# Patient Record
Sex: Male | Born: 1970 | Race: White | Hispanic: No | Marital: Single | Smoking: Former smoker
Health system: Southern US, Community
[De-identification: ages and names within clinical notes are randomized; demographics above are authoritative.]

## PROBLEM LIST (undated history)

## (undated) DIAGNOSIS — E785 Hyperlipidemia, unspecified: Secondary | ICD-10-CM

## (undated) DIAGNOSIS — K219 Gastro-esophageal reflux disease without esophagitis: Secondary | ICD-10-CM

## (undated) DIAGNOSIS — I10 Essential (primary) hypertension: Secondary | ICD-10-CM

## (undated) HISTORY — PX: REPAIR / SUTURE TESTICULAR INJURY: SUR1151

## (undated) HISTORY — DX: Gastro-esophageal reflux disease without esophagitis: K21.9

## (undated) HISTORY — PX: ORCHIOPEXY: SHX479

## (undated) HISTORY — DX: Hyperlipidemia, unspecified: E78.5

---

## 1998-08-24 ENCOUNTER — Ambulatory Visit (HOSPITAL_COMMUNITY): Admission: RE | Admit: 1998-08-24 | Discharge: 1998-08-24 | Payer: Self-pay | Admitting: Internal Medicine

## 1998-08-24 ENCOUNTER — Encounter: Payer: Self-pay | Admitting: Internal Medicine

## 2006-12-26 ENCOUNTER — Emergency Department (HOSPITAL_COMMUNITY): Admission: EM | Admit: 2006-12-26 | Discharge: 2006-12-26 | Payer: Self-pay | Admitting: Emergency Medicine

## 2008-01-14 ENCOUNTER — Emergency Department (HOSPITAL_COMMUNITY): Admission: EM | Admit: 2008-01-14 | Discharge: 2008-01-14 | Payer: Self-pay | Admitting: Emergency Medicine

## 2011-08-13 ENCOUNTER — Ambulatory Visit: Payer: BC Managed Care – PPO | Admitting: Family Medicine

## 2011-08-13 VITALS — BP 138/98 | HR 86 | Temp 98.5°F | Resp 16 | Ht 71.0 in | Wt 188.6 lb

## 2011-08-13 DIAGNOSIS — J069 Acute upper respiratory infection, unspecified: Secondary | ICD-10-CM

## 2011-08-13 DIAGNOSIS — J329 Chronic sinusitis, unspecified: Secondary | ICD-10-CM

## 2011-08-13 MED ORDER — AMOXICILLIN 875 MG PO TABS: 875.0000 mg | ORAL_TABLET | Freq: Two times a day (BID) | ORAL | Status: AC

## 2011-08-13 MED ORDER — FLUTICASONE PROPIONATE 50 MCG/ACT NA SUSP: 2.0000 | Freq: Every day | NASAL | Status: DC

## 2011-08-13 NOTE — Progress Notes (Signed)
Subjective: 41 year old male who's been having problems for the past several days with upper sparked right congestion. He was feverish yesterday morning. He is tender in his sinuses. He has been blowing purulent mucus out of his nose. He does have some cough. He has been working despite not feeling well.  Objective: Alert oriented no acute distress but doesn't look like he feels great. His TMs are normal. Nose congested. Tender over his maxillary sinuses. Throat was clear. Neck supple without nodes or thyromegaly. Chest clear to auscultation. Heart regular without murmurs. No CVA tenderness.  Assessment: Sinusitis and URI  Plan:   Amoxicillin 875 twice a day Flonase 2 sprays twice a day for 3 days, then drop back to 2 sprays daily.  Number labs will be done today. He can return if worse.

## 2011-08-13 NOTE — Patient Instructions (Addendum)

## 2012-01-24 ENCOUNTER — Ambulatory Visit (INDEPENDENT_AMBULATORY_CARE_PROVIDER_SITE_OTHER): Payer: BC Managed Care – PPO | Admitting: Emergency Medicine

## 2012-01-24 VITALS — BP 114/84 | HR 74 | Temp 97.9°F | Resp 16 | Ht 70.0 in | Wt 186.2 lb

## 2012-01-24 DIAGNOSIS — J029 Acute pharyngitis, unspecified: Secondary | ICD-10-CM

## 2012-01-24 MED ORDER — AZITHROMYCIN 250 MG PO TABS: ORAL_TABLET | ORAL | Status: DC

## 2012-01-24 NOTE — Patient Instructions (Addendum)

## 2012-01-24 NOTE — Progress Notes (Signed)
[facility] [address] [place] [address] [phone]  Date:  01/24/2012   Name:  Eddie Johnson   DOB:  [birth year]   MRN:  [id number]  PCP:  No primary provider on file.    Chief Complaint: Sore Throat and Otalgia   History of Present Illness:  Eddie Johnson is a 41 y.o. very pleasant male patient who presents with the following:  Sore throat past few days.  Has pain in left ear and left neck. Last night awakened at 0200 with pain and unable to sleep for a short terms.  No cough or coryza, fever or chills. No wheezing or shortness of breath.  No nausea or vomiting or stool change.  There is no problem list on file for this patient.   History reviewed. No pertinent past medical history.  History reviewed. No pertinent past surgical history.  History  Substance Use Topics  . Smoking status: Current Every Day Smoker -- 0.8 packs/day    Types: Cigarettes  . Smokeless tobacco: Not on file  . Alcohol Use: Not on file    Family History  Problem Relation Age of Onset  . Cancer Mother     No Known Allergies  Medication list has been reviewed and updated.  Current Outpatient Prescriptions on File Prior to Visit  Medication Sig Dispense Refill  . omeprazole (PRILOSEC) 10 MG capsule Take 10 mg by mouth daily.      . fluticasone (FLONASE) 50 MCG/ACT nasal spray Place 2 sprays into the nose daily.  16 g  6    Review of Systems:  As per HPI, otherwise negative.    Physical Examination: Filed Vitals:   01/24/12 1431  BP: 114/84  Pulse: 74  Temp: 97.9 F (36.6 C)  Resp: 16   Filed Vitals:   01/24/12 1431  Height: 5\' 10"  (1.778 m)  Weight: 186 lb 3.2 oz (84.46 kg)   Body mass index is 26.72 kg/(m^2). Ideal Body Weight: Weight in (lb) to have BMI = 25: 173.9   GEN: WDWN, NAD, Non-toxic, A & O x 3.   No rash or sepsis HEENT: Atraumatic, Normocephalic. Neck supple. No masses, left anterior cervical LAD.  Oropharynx red and  swollen left tonsil no exudate Ears and Nose: No external deformity.  TM negative CV: RRR, No M/G/R. No JVD. No thrill. No extra heart sounds. PULM: CTA B, no wheezes, crackles, rhonchi. No retractions. No resp. distress. No accessory muscle use. ABD: S, NT, ND, +BS. No rebound. No HSM. EXTR: No c/c/e NEURO Normal gait.  PSYCH: Normally interactive. Conversant. Not depressed or anxious appearing.  Calm demeanor.    Assessment and Plan: Pharyngitis Smoker zpak Stop smoking counseling Follow up as needed  Carmelina Dane, MD

## 2012-05-02 ENCOUNTER — Ambulatory Visit (INDEPENDENT_AMBULATORY_CARE_PROVIDER_SITE_OTHER): Payer: BC Managed Care – PPO | Admitting: Family Medicine

## 2012-05-02 VITALS — BP 138/86 | HR 88 | Temp 97.7°F | Resp 18 | Ht 71.5 in | Wt 191.0 lb

## 2012-05-02 DIAGNOSIS — J019 Acute sinusitis, unspecified: Secondary | ICD-10-CM

## 2012-05-02 MED ORDER — LEVOFLOXACIN 500 MG PO TABS: 500.0000 mg | ORAL_TABLET | Freq: Every day | ORAL | Status: DC

## 2012-05-02 MED ORDER — CETIRIZINE-PSEUDOEPHEDRINE ER 5-120 MG PO TB12: 1.0000 | ORAL_TABLET | Freq: Two times a day (BID) | ORAL | Status: DC

## 2012-05-02 NOTE — Progress Notes (Signed)
Patient ID: Eddie Johnson MRN: [id number], DOB: [birth year], 42 y.o. Date of Encounter: 05/02/2012, 11:52 AM  Primary Physician: No primary provider on file.  Chief Complaint:  Chief Complaint  Patient presents with  . Cough  . Dizziness  . Sore Throat    HPI: 42 y.o. year old male presents with 2 day history of nasal congestion, post nasal drip, sore throat, sinus pressure, and cough. Afebrile. No chills. Nasal congestion thick and green/yellow. Sinus pressure is the worst symptom. Cough is productive secondary to post nasal drip and not associated with time of day. Ears feel full, leading to sensation of muffled hearing. Has tried OTC cold preps without success. No GI complaints.   No recent antibiotics, recent travels, or sick contacts   No leg trauma, sedentary periods, h/o cancer, or tobacco use.  History reviewed. No pertinent past medical history.   Home Meds: Prior to Admission medications   Medication Sig Start Date End Date Taking? Authorizing Provider  omeprazole (PRILOSEC) 10 MG capsule Take 10 mg by mouth daily.   Yes Historical Provider, MD  vitamin C (ASCORBIC ACID) 500 MG tablet Take 500 mg by mouth daily.   Yes Historical Provider, MD  fluticasone (FLONASE) 50 MCG/ACT nasal spray Place 2 sprays into the nose daily. 08/13/11 08/12/12  Peyton Najjar, MD    Allergies: No Known Allergies  History   Social History  . Marital Status: Single    Spouse Name: N/A    Number of Children: N/A  . Years of Education: N/A   Occupational History  . Not on file.   Social History Main Topics  . Smoking status: Current Every Day Smoker -- 0.80 packs/day    Types: Cigarettes  . Smokeless tobacco: Not on file  . Alcohol Use: No  . Drug Use: No  . Sexually Active: Not on file   Other Topics Concern  . Not on file   Social History Narrative  . No narrative on file     Review of Systems: Constitutional: negative for chills, fever, night sweats or weight  changes Cardiovascular: negative for chest pain or palpitations Respiratory: negative for hemoptysis, wheezing, or shortness of breath Abdominal: negative for abdominal pain, nausea, vomiting or diarrhea Dermatological: negative for rash Neurologic: negative for headache   Physical Exam: Blood pressure 138/86, pulse 88, temperature 97.7 F (36.5 C), temperature source Oral, resp. rate 18, height 5' 11.5" (1.816 m), weight 191 lb (86.637 kg), SpO2 97.00%., Body mass index is 26.27 kg/(m^2). General: Well developed, well nourished, in no acute distress. Head: Normocephalic, atraumatic, eyes without discharge, sclera non-icteric, nares are congested. Bilateral auditory canals clear, TM's are without perforation, pearly grey with reflective cone of light bilaterally. Serous effusion bilaterally behind TM's. Maxillary sinus TTP. Oral cavity moist, dentition normal. Posterior pharynx with post nasal drip and mild erythema. No peritonsillar abscess or tonsillar exudate. Neck: Supple. No thyromegaly. Full ROM. No lymphadenopathy. Lungs: Clear bilaterally to auscultation without wheezes, rales, or rhonchi. Breathing is unlabored.  Heart: RRR with S1 S2. No murmurs, rubs, or gallops appreciated. Msk:  Strength and tone normal for age. Extremities: No clubbing or cyanosis. No edema. Neuro: Alert and oriented X 3. Moves all extremities spontaneously. CNII-XII grossly in tact. Psych:  Responds to questions appropriately with a normal affect.   Labs:   ASSESSMENT AND PLAN:  42 y.o. year old male with sinusitis -  -Tylenol/Motrin prn -Rest/fluids -RTC precautions -RTC 3-5 days if no improvement  Signed, Elvina Sidle,  MD 05/02/2012 11:52 AM

## 2012-05-02 NOTE — Patient Instructions (Addendum)

## 2012-09-29 ENCOUNTER — Other Ambulatory Visit: Payer: Self-pay

## 2012-09-29 DIAGNOSIS — J019 Acute sinusitis, unspecified: Secondary | ICD-10-CM

## 2012-09-29 MED ORDER — CETIRIZINE-PSEUDOEPHEDRINE ER 5-120 MG PO TB12: 1.0000 | ORAL_TABLET | Freq: Two times a day (BID) | ORAL | Status: DC

## 2012-11-12 ENCOUNTER — Telehealth: Payer: Self-pay

## 2012-11-12 DIAGNOSIS — J019 Acute sinusitis, unspecified: Secondary | ICD-10-CM

## 2012-11-12 MED ORDER — CETIRIZINE-PSEUDOEPHEDRINE ER 5-120 MG PO TB12: 1.0000 | ORAL_TABLET | Freq: Two times a day (BID) | ORAL | Status: DC

## 2012-11-12 NOTE — Telephone Encounter (Signed)
Pharmacist called and req'd RFs for zyrtec Rx. I gave her RF info on phone.

## 2013-05-04 ENCOUNTER — Other Ambulatory Visit: Payer: Self-pay | Admitting: Family Medicine

## 2013-06-10 ENCOUNTER — Other Ambulatory Visit: Payer: Self-pay | Admitting: Family Medicine

## 2013-09-01 ENCOUNTER — Ambulatory Visit (INDEPENDENT_AMBULATORY_CARE_PROVIDER_SITE_OTHER): Payer: BC Managed Care – PPO | Admitting: Emergency Medicine

## 2013-09-01 VITALS — BP 138/100 | HR 90 | Temp 98.1°F | Resp 16 | Ht 69.5 in | Wt 199.0 lb

## 2013-09-01 DIAGNOSIS — I10 Essential (primary) hypertension: Secondary | ICD-10-CM

## 2013-09-01 DIAGNOSIS — R040 Epistaxis: Secondary | ICD-10-CM

## 2013-09-01 LAB — POCT CBC
Granulocyte percent: 66.7 %G (ref 37–80)
HEMATOCRIT: 46 % (ref 43.5–53.7)
Hemoglobin: 15 g/dL (ref 14.1–18.1)
LYMPH, POC: 2.1 (ref 0.6–3.4)
MCH, POC: 30.2 pg (ref 27–31.2)
MCHC: 32.5 g/dL (ref 31.8–35.4)
MCV: 93 fL (ref 80–97)
MID (cbc): 0.5 (ref 0–0.9)
MPV: 7.6 fL (ref 0–99.8)
POC GRANULOCYTE: 5.1 (ref 2–6.9)
POC LYMPH %: 27.3 % (ref 10–50)
POC MID %: 6 %M (ref 0–12)
Platelet Count, POC: 264 10*3/uL (ref 142–424)
RBC: 4.95 M/uL (ref 4.69–6.13)
RDW, POC: 12.9 %
WBC: 7.7 10*3/uL (ref 4.6–10.2)

## 2013-09-01 MED ORDER — AMLODIPINE BESYLATE 5 MG PO TABS: 5.0000 mg | ORAL_TABLET | Freq: Every day | ORAL | Status: DC

## 2013-09-01 NOTE — Patient Instructions (Signed)
Hypertension  Hypertension, commonly called high blood pressure, is when the force of blood pumping through your arteries is too strong. Your arteries are the blood vessels that carry blood from your heart throughout your body. A blood pressure reading consists of a higher number over a lower number, such as 110/72. The higher number (systolic) is the pressure inside your arteries when your heart pumps. The lower number (diastolic) is the pressure inside your arteries when your heart relaxes. Ideally you want your blood pressure below 120/80.  Hypertension forces your heart to work harder to pump blood. Your arteries may become narrow or stiff. Having hypertension puts you at risk for heart disease, stroke, and other problems.   RISK FACTORS  Some risk factors for high blood pressure are controllable. Others are not.   Risk factors you cannot control include:    Race. You may be at higher risk if you are African American.   Age. Risk increases with age.   Gender. Men are at higher risk than women before age 45 years. After age 65, women are at higher risk than men.  Risk factors you can control include:   Not getting enough exercise or physical activity.   Being overweight.   Getting too much fat, sugar, calories, or salt in your diet.   Drinking too much alcohol.  SIGNS AND SYMPTOMS  Hypertension does not usually cause signs or symptoms. Extremely high blood pressure (hypertensive crisis) may cause headache, anxiety, shortness of breath, and nosebleed.  DIAGNOSIS   To check if you have hypertension, your health care provider will measure your blood pressure while you are seated, with your arm held at the level of your heart. It should be measured at least twice using the same arm. Certain conditions can cause a difference in blood pressure between your right and left arms. A blood pressure reading that is higher than normal on one occasion does not mean that you need treatment. If one blood pressure reading  is high, ask your health care provider about having it checked again.  TREATMENT   Treating high blood pressure includes making lifestyle changes and possibly taking medication. Living a healthy lifestyle can help lower high blood pressure. You may need to change some of your habits.  Lifestyle changes may include:   Following the DASH diet. This diet is high in fruits, vegetables, and whole grains. It is low in salt, red meat, and added sugars.   Getting at least 2 1/2 hours of brisk physical activity every week.   Losing weight if necessary.   Not smoking.   Limiting alcoholic beverages.   Learning ways to reduce stress.  If lifestyle changes are not enough to get your blood pressure under control, your health care provider may prescribe medicine. You may need to take more than one. Work closely with your health care provider to understand the risks and benefits.  HOME CARE INSTRUCTIONS   Have your blood pressure rechecked as directed by your health care provider.    Only take medicine as directed by your health care provider. Follow the directions carefully. Blood pressure medicines must be taken as prescribed. The medicine does not work as well when you skip doses. Skipping doses also puts you at risk for problems.    Do not smoke.    Monitor your blood pressure at home as directed by your health care provider.  SEEK MEDICAL CARE IF:    You think you are having a reaction to medicines taken.     You have recurrent headaches or feel dizzy.   You have swelling in your ankles.   You have trouble with your vision.  SEEK IMMEDIATE MEDICAL CARE IF:   You develop a severe headache or confusion.   You have unusual weakness, numbness, or feel faint.   You have severe chest or abdominal pain.   You vomit repeatedly.   You have trouble breathing.  MAKE SURE YOU:    Understand these instructions.   Will watch your condition.   Will get help right away if you are not doing well or get  worse.  Document Released: 01/28/2005 Document Revised: 02/02/2013 Document Reviewed: 11/20/2012  Exit[employer] Patient Information 2015 [employer]. This information is not intended to replace advice given to you by your health care provider. Make sure you discuss any questions you have with your health care provider.  Nosebleed  Nosebleeds can be caused by many conditions including trauma, infections, polyps, foreign bodies, dry mucous membranes or climate, medications and air conditioning. Most nosebleeds occur in the front of the nose. It is because of this location that most nosebleeds can be controlled by pinching the nostrils gently and continuously. Do this for at least 10 to 20 minutes. The reason for this long continuous pressure is that you must hold it long enough for the blood to clot. If during that 10 to 20 minute time period, pressure is released, the process may have to be started again. The nosebleed may stop by itself, quit with pressure, need concentrated heating (cautery) or stop with pressure from packing.  HOME CARE INSTRUCTIONS    If your nose was packed, try to maintain the pack inside until your caregiver removes it. If a gauze pack was used and it starts to fall out, gently replace or cut the end off. Do not cut if a balloon catheter was used to pack the nose. Otherwise, do not remove unless instructed.   Avoid blowing your nose for 12 hours after treatment. This could dislodge the pack or clot and start bleeding again.   If the bleeding starts again, sit up and bending forward, gently pinch the front half of your nose continuously for 20 minutes.   If bleeding was caused by dry mucous membranes, cover the inside of your nose every morning with a petroleum or antibiotic ointment. Use your little fingertip as an applicator. Do this as needed during dry weather. This will keep the mucous membranes moist and allow them to heal.   Maintain humidity in your home by using less air  conditioning or using a humidifier.   Do not use aspirin or medications which make bleeding more likely. Your caregiver can give you recommendations on this.   Resume normal activities as able but try to avoid straining, lifting or bending at the waist for several days.   If the nosebleeds become recurrent and the cause is unknown, your caregiver may suggest laboratory tests.  SEEK IMMEDIATE MEDICAL CARE IF:    Bleeding recurs and cannot be controlled.   There is unusual bleeding from or bruising on other parts of the body.   You have a fever.   Nosebleeds continue.   There is any worsening of the condition which originally brought you in.   You become lightheaded, feel faint, become sweaty or vomit blood.  MAKE SURE YOU:    Understand these instructions.   Will watch your condition.   Will get help right away if you are not doing well or get worse.    Document Released: 11/07/2004 Document Revised: 04/22/2011 Document Reviewed: 12/30/2008  Exit[employer] Patient Information 2015 [employer]. This information is not intended to replace advice given to you by your health care provider. Make sure you discuss any questions you have with your health care provider.

## 2013-09-01 NOTE — Progress Notes (Signed)
   Subjective:    Patient ID: Eddie Johnson, male    DOB: [birth year], 43 y.o.   MRN: [id number]  HPI  43 y.o. Male presents to clinic to discuss a nose bleed he had Monday night . States that he used saline to stop it. Reports feeling sluggish and fatigued. States that over the past week he has noticed dried blood in his right ear. Denies having any chest pain. Has family history of hypertension.  States that he hasn't noticed hugh weight change and eats some salt in his diet; also reports having stopped smoking earlier this year.  Review of Systems     Objective:   Physical Exam patient is alert and cooperative. His neck is supple. His chest is clear to auscultation and percussion. Heart regular rate without murmurs rubs or gallops. There is irritation to the inside of both nares. There is a bleeding site present on the left which is scabbed over  Results for orders placed in visit on 09/01/13  POCT CBC      Result Value Ref Range   WBC 7.7  4.6 - 10.2 K/uL   Lymph, poc 27.3 (*) 0.6 - 3.4   POC LYMPH PERCENT 2.1 (*) 10 - 50 %L   MID (cbc) 6.0 (*) 0 - 0.9   POC MID % 0.5  0 - 12 %M   POC Granulocyte 66.7 (*) 2 - 6.9   Granulocyte percent 5.1 (*) 37 - 80 %G   RBC 4.95  4.69 - 6.13 M/uL   Hemoglobin 15.0  14.1 - 18.1 g/dL   HCT, POC 56.246.0  13.043.5 - 53.7 %   MCV 93.0  80 - 97 fL   MCH, POC 30.2  27 - 31.2 pg   MCHC 32.5  31.8 - 35.4 g/dL   RDW, POC 86.512.9     Platelet Count, POC 264  142 - 424 K/uL   MPV 7.6  0 - 99.8 fL        Assessment & Plan:  Patient started on amlodipine 5 mg for blood pressure control. He will apply Vaseline to the inside of the nose 3 times a day. He'll continue to use saline spray

## 2013-09-02 LAB — BASIC METABOLIC PANEL WITH GFR
BUN: 10 mg/dL (ref 6–23)
CALCIUM: 9.3 mg/dL (ref 8.4–10.5)
CO2: 26 mEq/L (ref 19–32)
Chloride: 104 mEq/L (ref 96–112)
Creat: 0.69 mg/dL (ref 0.50–1.35)
GFR, Est African American: >89 mL/min
GFR, Est Non African American: >89 mL/min
GLUCOSE: 87 mg/dL (ref 70–99)
Potassium: 4.2 mEq/L (ref 3.5–5.3)
Sodium: 138 mEq/L (ref 135–145)

## 2013-09-02 LAB — TSH: TSH: 1.122 u[IU]/mL (ref 0.350–4.500)

## 2013-12-20 ENCOUNTER — Ambulatory Visit (INDEPENDENT_AMBULATORY_CARE_PROVIDER_SITE_OTHER): Payer: BC Managed Care – PPO | Admitting: Emergency Medicine

## 2013-12-20 VITALS — BP 140/96 | HR 94 | Temp 98.4°F | Resp 18 | Ht 70.75 in | Wt 200.0 lb

## 2013-12-20 DIAGNOSIS — J0100 Acute maxillary sinusitis, unspecified: Secondary | ICD-10-CM

## 2013-12-20 DIAGNOSIS — J209 Acute bronchitis, unspecified: Secondary | ICD-10-CM

## 2013-12-20 MED ORDER — PROMETHAZINE-CODEINE 6.25-10 MG/5ML PO SYRP: 5.0000 mL | ORAL_SOLUTION | Freq: Four times a day (QID) | ORAL | Status: DC | PRN

## 2013-12-20 MED ORDER — AMOXICILLIN-POT CLAVULANATE 875-125 MG PO TABS: 1.0000 | ORAL_TABLET | Freq: Two times a day (BID) | ORAL | Status: DC

## 2013-12-20 MED ORDER — PSEUDOEPHEDRINE-GUAIFENESIN ER 60-600 MG PO TB12: 1.0000 | ORAL_TABLET | Freq: Two times a day (BID) | ORAL | Status: DC

## 2013-12-20 NOTE — Patient Instructions (Signed)

## 2013-12-20 NOTE — Progress Notes (Signed)
[facility] [address], [place] [id number] [phone]  Date:  12/20/2013   Name:  Eddie Johnson   DOB:  November 30, 1970   MRN:  [id number]  PCP:  No PCP Per Patient    Chief Complaint: Cough; Nasal Congestion; and Fever   History of Present Illness:  Eddie Johnson is a 43 y.o. very pleasant male patient who presents with the following:  Ill since Friday.  Has sinus pressure and nasal congestion and purulent nasal discharge.  No sore throat Has a cough productive of purulent sputum.  No wheezing or shortness of breath. No fever now but was febrile on Friday  or chills.  No nausea or vomiting No stool change. No rash No improvement with over the counter medications or other home remedies.  Denies other complaint or health concern today.  Patient Active Problem List   Diagnosis Date Noted  . Unspecified essential hypertension 09/01/2013    History reviewed. No pertinent past medical history.  History reviewed. No pertinent past surgical history.  History  Substance Use Topics  . Smoking status: Current Every Day Smoker -- 0.80 packs/day    Types: Cigarettes    Start date: 03/21/2012  . Smokeless tobacco: Not on file  . Alcohol Use: No    Family History  Problem Relation Age of Onset  . Cancer Mother     No Known Allergies  Medication list has been reviewed and updated.  Current Outpatient Prescriptions on File Prior to Visit  Medication Sig Dispense Refill  . amLODipine (NORVASC) 5 MG tablet Take 1 tablet (5 mg total) by mouth daily. 30 tablet 5  . cetirizine-pseudoephedrine (ALL DAY ALLERGY-D) 5-120 MG per tablet Take 1 tablet by mouth 2 (two) times daily. PATIENT NEEDS OFFICE VISIT FOR ADDITIONAL REFILLS 60 tablet 0  . omeprazole (PRILOSEC) 10 MG capsule Take 10 mg by mouth daily.    . vitamin C (ASCORBIC ACID) 500 MG tablet Take 500 mg by mouth daily.    . fluticasone (FLONASE) 50 MCG/ACT nasal spray Place 2 sprays into the  nose daily. 16 g 6   No current facility-administered medications on file prior to visit.    Review of Systems:  As per HPI, otherwise negative.    Physical Examination: Filed Vitals:   12/20/13 1219  BP: 140/96  Pulse: 94  Temp: 98.4 F (36.9 C)  Resp: 18   Filed Vitals:   12/20/13 1219  Height: 5' 10.75" (1.797 m)  Weight: 200 lb (90.719 kg)   Body mass index is 28.09 kg/(m^2). Ideal Body Weight: Weight in (lb) to have BMI = 25: 177.6  GEN: WDWN, NAD, Non-toxic, A & O x 3 HEENT: Atraumatic, Normocephalic. Neck supple. No masses, No LAD. Ears and Nose: No external deformity. CV: RRR, No M/G/R. No JVD. No thrill. No extra heart sounds. PULM: CTA B, no wheezes, crackles, rhonchi. No retractions. No resp. distress. No accessory muscle use. ABD: S, NT, ND, +BS. No rebound. No HSM. EXTR: No c/c/e NEURO Normal gait.  PSYCH: Normally interactive. Conversant. Not depressed or anxious appearing.  Calm demeanor.    Assessment and Plan: Sinusitis Bronchitis augmentin mucinex  Phen c cod  Signed,  Phillips OdorJeffery Golda Zavalza, MD

## 2014-03-01 ENCOUNTER — Other Ambulatory Visit: Payer: Self-pay | Admitting: Emergency Medicine

## 2014-04-07 ENCOUNTER — Other Ambulatory Visit: Payer: Self-pay | Admitting: Physician Assistant

## 2014-04-23 ENCOUNTER — Telehealth: Payer: Self-pay | Admitting: Physician Assistant

## 2014-04-25 NOTE — Telephone Encounter (Signed)
Pt states he will be in to see Dr. Valarie ConesWeber on nest Tursday, would like to know if this can be refilled till then. Please advise

## 2014-04-25 NOTE — Telephone Encounter (Signed)
Verified w/pt that he will be in next Thursday and sent in 10 day RF to cover until then.

## 2014-05-03 ENCOUNTER — Ambulatory Visit (INDEPENDENT_AMBULATORY_CARE_PROVIDER_SITE_OTHER): Payer: BLUE CROSS/BLUE SHIELD | Admitting: Family Medicine

## 2014-05-03 VITALS — BP 140/104 | HR 79 | Temp 97.9°F | Resp 16 | Ht 70.75 in | Wt 205.0 lb

## 2014-05-03 DIAGNOSIS — I10 Essential (primary) hypertension: Secondary | ICD-10-CM

## 2014-05-03 MED ORDER — AMLODIPINE BESYLATE 5 MG PO TABS: 5.0000 mg | ORAL_TABLET | Freq: Two times a day (BID) | ORAL | Status: DC

## 2014-05-03 NOTE — Patient Instructions (Signed)
DASH Eating Plan DASH stands for "Dietary Approaches to Stop Hypertension." The DASH eating plan is a healthy eating plan that has been shown to reduce high blood pressure (hypertension). Additional health benefits may include reducing the risk of type 2 diabetes mellitus, heart disease, and stroke. The DASH eating plan may also help with weight loss. WHAT DO I NEED TO KNOW ABOUT THE DASH EATING PLAN? For the DASH eating plan, you will follow these general guidelines:  Choose foods with a percent daily value for sodium of less than 5% (as listed on the food label).  Use salt-free seasonings or herbs instead of table salt or sea salt.  Check with your health care provider or pharmacist before using salt substitutes.  Eat lower-sodium products, often labeled as "lower sodium" or "no salt added."  Eat fresh foods.  Eat more vegetables, fruits, and low-fat dairy products.  Choose whole grains. Look for the word "whole" as the first word in the ingredient list.  Choose fish and skinless chicken or turkey more often than red meat. Limit fish, poultry, and meat to 6 oz (170 g) each day.  Limit sweets, desserts, sugars, and sugary drinks.  Choose heart-healthy fats.  Limit cheese to 1 oz (28 g) per day.  Eat more home-cooked food and less restaurant, buffet, and fast food.  Limit fried foods.  Cook foods using methods other than frying.  Limit canned vegetables. If you do use them, rinse them well to decrease the sodium.  When eating at a restaurant, ask that your food be prepared with less salt, or no salt if possible. WHAT FOODS CAN I EAT? Seek help from a dietitian for individual calorie needs. Grains Whole grain or whole wheat bread. Brown rice. Whole grain or whole wheat pasta. Quinoa, bulgur, and whole grain cereals. Low-sodium cereals. Corn or whole wheat flour tortillas. Whole grain cornbread. Whole grain crackers. Low-sodium crackers. Vegetables Fresh or frozen vegetables  (raw, steamed, roasted, or grilled). Low-sodium or reduced-sodium tomato and vegetable juices. Low-sodium or reduced-sodium tomato sauce and paste. Low-sodium or reduced-sodium canned vegetables.  Fruits All fresh, canned (in natural juice), or frozen fruits. Meat and Other Protein Products Ground beef (85% or leaner), grass-fed beef, or beef trimmed of fat. Skinless chicken or turkey. Ground chicken or turkey. Pork trimmed of fat. All fish and seafood. Eggs. Dried beans, peas, or lentils. Unsalted nuts and seeds. Unsalted canned beans. Dairy Low-fat dairy products, such as skim or 1% milk, 2% or reduced-fat cheeses, low-fat ricotta or cottage cheese, or plain low-fat yogurt. Low-sodium or reduced-sodium cheeses. Fats and Oils Tub margarines without trans fats. Light or reduced-fat mayonnaise and salad dressings (reduced sodium). Avocado. Safflower, olive, or canola oils. Natural peanut or almond butter. Other Unsalted popcorn and pretzels. The items listed above may not be a complete list of recommended foods or beverages. Contact your dietitian for more options. WHAT FOODS ARE NOT RECOMMENDED? Grains White bread. White pasta. White rice. Refined cornbread. Bagels and croissants. Crackers that contain trans fat. Vegetables Creamed or fried vegetables. Vegetables in a cheese sauce. Regular canned vegetables. Regular canned tomato sauce and paste. Regular tomato and vegetable juices. Fruits Dried fruits. Canned fruit in light or heavy syrup. Fruit juice. Meat and Other Protein Products Fatty cuts of meat. Ribs, chicken wings, bacon, sausage, bologna, salami, chitterlings, fatback, hot dogs, bratwurst, and packaged luncheon meats. Salted nuts and seeds. Canned beans with salt. Dairy Whole or 2% milk, cream, half-and-half, and cream cheese. Whole-fat or sweetened yogurt. Full-fat   cheeses or blue cheese. Nondairy creamers and whipped toppings. Processed cheese, cheese spreads, or cheese  curds. Condiments Onion and garlic salt, seasoned salt, table salt, and sea salt. Canned and packaged gravies. Worcestershire sauce. Tartar sauce. Barbecue sauce. Teriyaki sauce. Soy sauce, including reduced sodium. Steak sauce. Fish sauce. Oyster sauce. Cocktail sauce. Horseradish. Ketchup and mustard. Meat flavorings and tenderizers. Bouillon cubes. Hot sauce. Tabasco sauce. Marinades. Taco seasonings. Relishes. Fats and Oils Butter, stick margarine, lard, shortening, ghee, and bacon fat. Coconut, palm kernel, or palm oils. Regular salad dressings. Other Pickles and olives. Salted popcorn and pretzels. The items listed above may not be a complete list of foods and beverages to avoid. Contact your dietitian for more information. WHERE CAN I FIND MORE INFORMATION? National Heart, Lung, and [employer] Institute: www.nhlbi.nih.gov/health/health-topics/topics/dash/ Document Released: 01/17/2011 Document Revised: 06/14/2013 Document Reviewed: 12/02/2012 ExitCare Patient Information 2015 [employer]. This information is not intended to replace advice given to you by your health care provider. Make sure you discuss any questions you have with your health care provider. Hypertension Hypertension is another name for high blood pressure. High blood pressure forces your heart to work harder to pump blood. A blood pressure reading has two numbers, which includes a higher number over a lower number (example: 110/72). HOME CARE   Have your blood pressure rechecked by your doctor.  Only take medicine as told by your doctor. Follow the directions carefully. The medicine does not work as well if you skip doses. Skipping doses also puts you at risk for problems.  Do not smoke.  Monitor your blood pressure at home as told by your doctor. GET HELP IF:  You think you are having a reaction to the medicine you are taking.  You have repeat headaches or feel dizzy.  You have puffiness (swelling) in your  ankles.  You have trouble with your vision. GET HELP RIGHT AWAY IF:   You get a very bad headache and are confused.  You feel weak, numb, or faint.  You get chest or belly (abdominal) pain.  You throw up (vomit).  You cannot breathe very well. MAKE SURE YOU:   Understand these instructions.  Will watch your condition.  Will get help right away if you are not doing well or get worse. Document Released: 07/17/2007 Document Revised: 02/02/2013 Document Reviewed: 11/20/2012 ExitCare Patient Information 2015 [employer]. This information is not intended to replace advice given to you by your health care provider. Make sure you discuss any questions you have with your health care provider.  

## 2014-05-03 NOTE — Progress Notes (Signed)
Chief Complaint:  Chief Complaint  Patient presents with  . Hypertension  . Medication Refill    HPI: Eddie Johnson is a 44 y.o. male who is here for hypertension medication refills. He is not interested in having blood work drawn on a regular basis. He is not a big fan of needle sticks.  He is tolerating his amlodipine 5 mg daily. On average his blood sure his have been running 120 to 130 systolic, 80-100 diastolic.  He works at  [employer] in [place] and it is a highly stressful job. He also has stress at home. His mother has lung cancer and it sounds like it has metastasized. He had labs done last year in July and everything looked okay from an electrolyte and kidney standpoint. He really does not want to have any lab done on a regular basis due to his fear of needles. He "really, really, really "does not like needlesticks.  No past medical history on file. No past surgical history on file. History   Social History  . Marital Status: Single    Spouse Name: N/A  . Number of Children: N/A  . Years of Education: N/A   Social History Main Topics  . Smoking status: Former Smoker -- 0.80 packs/day    Types: Cigarettes    Quit date: 04/04/2012  . Smokeless tobacco: Not on file  . Alcohol Use: No  . Drug Use: No  . Sexual Activity: Not on file   Other Topics Concern  . Not on file   Social History Narrative   Family History  Problem Relation Age of Onset  . Cancer Mother    No Known Allergies Prior to Admission medications   Medication Sig Start Date End Date Taking? Authorizing Provider  amLODipine (NORVASC) 5 MG tablet Take 1 tablet (5 mg total) by mouth daily. NO MORE REFILLS WITHOUT OFFICE VISIT - FINAL NOTICE 04/25/14   Morrell RiddleSarah L Weber, PA-C  cetirizine-pseudoephedrine (ALL DAY ALLERGY-D) 5-120 MG per tablet Take 1 tablet by mouth 2 (two) times daily. PATIENT NEEDS OFFICE VISIT FOR ADDITIONAL REFILLS    Eleanore E Egan, PA-C  omeprazole (PRILOSEC) 10 MG  capsule Take 10 mg by mouth daily.    Historical Provider, MD  vitamin C (ASCORBIC ACID) 500 MG tablet Take 500 mg by mouth daily.    Historical Provider, MD     ROS: The patient denies fevers, chills, night sweats, unintentional weight loss, chest pain, palpitations, wheezing, dyspnea on exertion, nausea, vomiting, abdominal pain, dysuria, hematuria, melena, numbness, weakness, or tingling.   All other systems have been reviewed and were otherwise negative with the exception of those mentioned in the HPI and as above.    PHYSICAL EXAM: Filed Vitals:   05/03/14 1459  BP: 140/104  Pulse: 79  Temp: 97.9 F (36.6 C)  Resp: 16   Filed Vitals:   05/03/14 1459  Height: 5' 10.75" (1.797 m)  Weight: 205 lb (92.987 kg)   Body mass index is 28.8 kg/(m^2).  General: Alert, no acute distress HEENT:  Normocephalic, atraumatic, oropharynx patent. EOMI, PERRLA Cardiovascular:  Regular rate and rhythm, no rubs murmurs or gallops.  No Carotid bruits, radial pulse intact. No pedal edema.  Respiratory: Clear to auscultation bilaterally.  No wheezes, rales, or rhonchi.  No cyanosis, no use of accessory musculature GI: No organomegaly, abdomen is soft and non-tender, positive bowel sounds.  No masses. Skin: No rashes. Neurologic: Facial musculature symmetric. Psychiatric: Patient is appropriate throughout  our interaction. Lymphatic: No cervical lymphadenopathy Musculoskeletal: Gait intact.   LABS: Results for orders placed or performed in visit on 09/01/13  BASIC METABOLIC PANEL WITH GFR  Result Value Ref Range   Sodium 138 135 - 145 mEq/L   Potassium 4.2 3.5 - 5.3 mEq/L   Chloride 104 96 - 112 mEq/L   CO2 26 19 - 32 mEq/L   Glucose, Bld 87 70 - 99 mg/dL   BUN 10 6 - 23 mg/dL   Creat 1.19 1.47 - 8.29 mg/dL   Calcium 9.3 8.4 - 56.2 mg/dL   GFR, Est African American >89 mL/min   GFR, Est Non African American >89 mL/min  TSH  Result Value Ref Range   TSH 1.122 0.350 - 4.500 uIU/mL  POCT  CBC  Result Value Ref Range   WBC 7.7 4.6 - 10.2 K/uL   Lymph, poc 2.1 0.6 - 3.4   POC LYMPH PERCENT 27.3 10 - 50 %L   MID (cbc) 0.5 0 - 0.9   POC MID % 6.0 0 - 12 %M   POC Granulocyte 5.1 2 - 6.9   Granulocyte percent 66.7 37 - 80 %G   RBC 4.95 4.69 - 6.13 M/uL   Hemoglobin 15.0 14.1 - 18.1 g/dL   HCT, POC 13.0 86.5 - 53.7 %   MCV 93.0 80 - 97 fL   MCH, POC 30.2 27 - 31.2 pg   MCHC 32.5 31.8 - 35.4 g/dL   RDW, POC 78.4 %   Platelet Count, POC 264 142 - 424 K/uL   MPV 7.6 0 - 99.8 fL     EKG/XRAY:   Primary read interpreted by Dr. Conley Rolls at [facility].   ASSESSMENT/PLAN: Encounter Diagnosis  Name Primary?  . Essential hypertension Yes   This is a pleasant 44 year old gentleman with a past medical history of essential hypertension who is here for medication refills. Daily I would like him to be started on hydrochlorothiazide 12.5 mg in addition to the Norvasc 5 mg. This would probably improve his systolic and diastolic blood pressure however he is deathly afraid of needles and does not want to come back every 6 months for blood work. We will go ahead and increase his Norvasc 5 mg from daily to twice daily. He will monitor his blood pressure and pulse. Currently he is asymptomatic on the Norvasc 5 mg. Follow-up in 2-4  weeks with blood pressure readings.  Follow-up for annual visit in July  Gross sideeffects, risk and benefits, and alternatives of medications d/w patient. Patient is aware that all medications have potential sideeffects and we are unable to predict every sideeffect or drug-drug interaction that may occur.  Hamilton Capri PHUONG, DO 05/04/2014 3:28 PM

## 2014-07-19 ENCOUNTER — Encounter (HOSPITAL_COMMUNITY): Payer: Self-pay | Admitting: *Deleted

## 2014-07-19 ENCOUNTER — Emergency Department (HOSPITAL_COMMUNITY)
Admission: EM | Admit: 2014-07-19 | Discharge: 2014-07-19 | Disposition: A | Discharge status: Home / Self Care | Payer: BLUE CROSS/BLUE SHIELD | Attending: Emergency Medicine | Admitting: Emergency Medicine

## 2014-07-19 ENCOUNTER — Emergency Department (HOSPITAL_COMMUNITY): Payer: BLUE CROSS/BLUE SHIELD

## 2014-07-19 DIAGNOSIS — I10 Essential (primary) hypertension: Secondary | ICD-10-CM | POA: Insufficient documentation

## 2014-07-19 DIAGNOSIS — M791 Myalgia: Secondary | ICD-10-CM | POA: Diagnosis present

## 2014-07-19 DIAGNOSIS — Z79899 Other long term (current) drug therapy: Secondary | ICD-10-CM | POA: Insufficient documentation

## 2014-07-19 DIAGNOSIS — R630 Anorexia: Secondary | ICD-10-CM | POA: Diagnosis not present

## 2014-07-19 DIAGNOSIS — J029 Acute pharyngitis, unspecified: Secondary | ICD-10-CM | POA: Insufficient documentation

## 2014-07-19 DIAGNOSIS — Z87891 Personal history of nicotine dependence: Secondary | ICD-10-CM | POA: Diagnosis not present

## 2014-07-19 DIAGNOSIS — R079 Chest pain, unspecified: Secondary | ICD-10-CM | POA: Insufficient documentation

## 2014-07-19 HISTORY — DX: Essential (primary) hypertension: I10

## 2014-07-19 LAB — TROPONIN I: Troponin I: <0.03 ng/mL (ref <0.031)

## 2014-07-19 LAB — CBC
HEMATOCRIT: 41.7 % (ref 39.0–52.0)
Hemoglobin: 14.5 g/dL (ref 13.0–17.0)
MCH: 31.3 pg (ref 26.0–34.0)
MCHC: 34.8 g/dL (ref 30.0–36.0)
MCV: 89.9 fL (ref 78.0–100.0)
PLATELETS: 215 10*3/uL (ref 150–400)
RBC: 4.64 MIL/uL (ref 4.22–5.81)
RDW: 12.7 % (ref 11.5–15.5)
WBC: 10.7 10*3/uL — ABNORMAL HIGH (ref 4.0–10.5)

## 2014-07-19 LAB — BASIC METABOLIC PANEL
Anion gap: 12 (ref 5–15)
BUN: 6 mg/dL (ref 6–20)
CHLORIDE: 101 mmol/L (ref 101–111)
CO2: 24 mmol/L (ref 22–32)
CREATININE: 0.79 mg/dL (ref 0.61–1.24)
Calcium: 9.3 mg/dL (ref 8.9–10.3)
GFR calc non Af Amer: >60 mL/min (ref >60)
Glucose, Bld: 112 mg/dL — ABNORMAL HIGH (ref 65–99)
POTASSIUM: 3.9 mmol/L (ref 3.5–5.1)
SODIUM: 137 mmol/L (ref 135–145)

## 2014-07-19 LAB — I-STAT TROPONIN, ED: TROPONIN I, POC: 0 ng/mL (ref 0.00–0.08)

## 2014-07-19 LAB — RAPID STREP SCREEN (MED CTR MEBANE ONLY): STREPTOCOCCUS, GROUP A SCREEN (DIRECT): NEGATIVE

## 2014-07-19 MED ORDER — PENICILLIN G BENZATHINE 1200000 UNIT/2ML IM SUSP
1.2000 10*6.[IU] | Freq: Once | INTRAMUSCULAR | Status: AC
  Administered 2014-07-19: 1.2 10*6.[IU] via INTRAMUSCULAR
  Filled 2014-07-19: qty 2

## 2014-07-19 MED ORDER — KETOROLAC TROMETHAMINE 60 MG/2ML IM SOLN
60.0000 mg | Freq: Once | INTRAMUSCULAR | Status: DC
  Filled 2014-07-19: qty 2

## 2014-07-19 NOTE — ED Notes (Signed)
Pt updated on delay of care, pt requesting to have something to eat, this RN states she will see if pt can have something to eat

## 2014-07-19 NOTE — ED Notes (Signed)
Pts sister requesting to speak with Charge RN, Laneta SimmersJessica Branch, RN at bedside

## 2014-07-19 NOTE — ED Notes (Signed)
Micheline Mazeocherty, MD at bedside eval pt & discussing results & plan of care

## 2014-07-19 NOTE — ED Provider Notes (Signed)
CSN: [id number]     Arrival date & time 07/19/14  7253 History   First MD Initiated Contact with Patient 07/19/14 [phone]     Chief Complaint  Patient presents with  . Generalized Body Aches  . Chest Pain     (Consider location/radiation/quality/duration/timing/severity/associated sxs/prior Treatment) HPI Comments: Pt is a 44 y.o. male with Pmhx as above who presents with multiple medical complaints including myalgias, malaise, subjective fever, chills, sore throat and several brief episodes of sharp L sided CP yesterday and today w/o assoc symptoms. + sick contacts at work.    Past Medical History  Diagnosis Date  . Hypertension    Past Surgical History  Procedure Laterality Date  . Inguinal hernia repair     Family History  Problem Relation Age of Onset  . Cancer Mother    History  Substance Use Topics  . Smoking status: Former Smoker -- 0.80 packs/day    Types: Cigarettes    Quit date: 04/04/2012  . Smokeless tobacco: Not on file  . Alcohol Use: No    Review of Systems  Constitutional: Positive for fever, chills, activity change, appetite change and fatigue.  HENT: Positive for sore throat. Negative for congestion, facial swelling, rhinorrhea and trouble swallowing.   Eyes: Negative for photophobia and pain.  Respiratory: Negative for cough, chest tightness and shortness of breath.   Cardiovascular: Positive for chest pain. Negative for leg swelling.  Gastrointestinal: Negative for nausea, vomiting, abdominal pain, diarrhea and constipation.  Endocrine: Negative for polydipsia and polyuria.  Genitourinary: Negative for dysuria, urgency, decreased urine volume and difficulty urinating.  Musculoskeletal: Negative for back pain and gait problem.  Skin: Negative for color change, rash and wound.  Allergic/Immunologic: Negative for immunocompromised state.  Neurological: Negative for dizziness, facial asymmetry, speech difficulty, weakness, numbness and headaches.    Psychiatric/Behavioral: Negative for confusion, decreased concentration and agitation.      Allergies  Review of patient's allergies indicates no known allergies.  Home Medications   Prior to Admission medications   Medication Sig Start Date End Date Taking? Authorizing Provider  acetaminophen (TYLENOL) 500 MG tablet Take 1,000 mg by mouth 2 (two) times daily as needed for mild pain.   Yes Historical Provider, MD  amLODipine (NORVASC) 5 MG tablet Take 1 tablet (5 mg total) by mouth 2 (two) times daily. NO MORE REFILLS WITHOUT OFFICE VISIT - FINAL NOTICE 05/03/14  Yes Thao P Le, DO  cetirizine-pseudoephedrine (ALL DAY ALLERGY-D) 5-120 MG per tablet Take 1 tablet by mouth 2 (two) times daily. PATIENT NEEDS OFFICE VISIT FOR ADDITIONAL REFILLS   Yes Eleanore E Egan, PA-C  omeprazole (PRILOSEC) 10 MG capsule Take 10 mg by mouth daily.   Yes Historical Provider, MD  sodium chloride (OCEAN) 0.65 % SOLN nasal spray Place 1 spray into both nostrils as needed for congestion.   Yes Historical Provider, MD  vitamin C (ASCORBIC ACID) 500 MG tablet Take 1,000 mg by mouth daily.    Yes Historical Provider, MD   BP 137/96 mmHg  Pulse 97  Temp(Src) 98.3 F (36.8 C) (Oral)  Resp 11  Ht  (1.803 m)  Wt 205 lb (92.987 kg)  BMI 28.60 kg/m2  SpO2 98% Physical Exam  Constitutional: He is oriented to person, place, and time. He appears well-developed and well-nourished. No distress.  HENT:  Head: Normocephalic and atraumatic.  Mouth/Throat: No oropharyngeal exudate.    Eyes: Pupils are equal, round, and reactive to light.  Neck: Normal range of motion. Neck supple.  Cardiovascular: Normal rate, regular rhythm and normal heart sounds.  Exam reveals no gallop and no friction rub.   No murmur heard. Pulmonary/Chest: Effort normal and breath sounds normal. No respiratory distress. He has no wheezes. He has no rales.  Abdominal: Soft. Bowel sounds are normal. He exhibits no distension and no mass.  There is no tenderness. There is no rebound and no guarding.  Musculoskeletal: Normal range of motion. He exhibits no edema or tenderness.  Neurological: He is alert and oriented to person, place, and time.  Skin: Skin is warm and dry.  Psychiatric: He has a normal mood and affect.    ED Course  Procedures (including critical care time) Labs Review Labs Reviewed  BASIC METABOLIC PANEL - Abnormal; Notable for the following:    Glucose, Bld 112 (*)    All other components within normal limits  CBC - Abnormal; Notable for the following:    WBC 10.7 (*)    All other components within normal limits  RAPID STREP SCREEN (NOT AT [facility])  CULTURE, GROUP A STREP  TROPONIN I  Rosezena SensorI-STAT TROPOININ, ED    Imaging Review Dg Chest 2 View  07/19/2014   CLINICAL DATA:  44 year old male with chills, shortness breath, dizziness, hypertension, chest pain. Initial encounter.  EXAM: CHEST  2 VIEW  COMPARISON:  None.  FINDINGS: Lung volumes are within normal limits. Normal cardiac size and mediastinal contours. Visualized tracheal air column is within normal limits. No pneumothorax, pulmonary edema, pleural effusion or confluent pulmonary opacity. No acute osseous abnormality identified.  IMPRESSION: No acute cardiopulmonary abnormality.   Electronically Signed   By: Odessa FlemingH  Hall M.D.   On: 07/19/2014 10:00     EKG Interpretation   Date/Time:  Tuesday July 19 2014 08:27:00 EDT Ventricular Rate:  95 PR Interval:  140 QRS Duration: 87 QT Interval:  326 QTC Calculation: 410 R Axis:   66 Text Interpretation:  Sinus rhythm Baseline wander in lead(s) V4 No prior  for comparison Confirmed by Dennise Raabe  MD, Lucella Pommier ([id number]) on 07/19/2014 8:31:58  AM      MDM   Final diagnoses:  Acute pharyngitis, unspecified pharyngitis type   Pt is a 44 y.o. male with Pmhx as above who presents with multiple medical complaints including myalgias, malaise, subjective fever, chills, sore throat and several brief episodes of sharp L  sided CP. On PE, VSS, pt in NAD. He has nml cardiopulm exam. HEENT with tonsillar exudates. 3 hrs delta trop negative, symptoms atypical for ACE< EKG nml, CXR clear. Suspect acute pharyngitis, rapid strep negative, however pt has 4 centor criteria and will be treated with IM pen G. Rec continued supportive care at home.      Carlyon ShadowJames A Weisheit evaluation in the Emergency Department is complete. It has been determined that no acute conditions requiring further emergency intervention are present at this time. The patient/guardian have been advised of the diagnosis and plan. We have discussed signs and symptoms that warrant return to the ED, such as changes or worsening in symptoms, worsening pain, SOB.       Toy CookeyMegan Markeshia Giebel, MD 07/20/14 1006

## 2014-07-19 NOTE — ED Notes (Signed)
Danielle H.,RN reports giving pt lunch bag

## 2014-07-19 NOTE — ED Notes (Signed)
Pt in from home c/o mid radiating CP into bil arms intermittent onset x 3-4 days, pt denies SOB, n/v/d, pt c/o generalized body aches, HA, & HTN, pt A&O x4, follows commands, speaks in complete sentences, denies slurred speech, pt c/o bil leg weakness

## 2014-07-19 NOTE — Discharge Instructions (Signed)
Viral Infections °A viral infection can be caused by different types of viruses. Most viral infections are not serious and resolve on their own. However, some infections may cause severe symptoms and may lead to further complications. °SYMPTOMS °Viruses can frequently cause: °· Minor sore throat. °· Aches and pains. °· Headaches. °· Runny nose. °· Different types of rashes. °· Watery eyes. °· Tiredness. °· Cough. °· Loss of appetite. °· Gastrointestinal infections, resulting in nausea, vomiting, and diarrhea. °These symptoms do not respond to antibiotics because the infection is not caused by bacteria. However, you might catch a bacterial infection following the viral infection. This is sometimes called a "superinfection." Symptoms of such a bacterial infection may include: °· Worsening sore throat with pus and difficulty swallowing. °· Swollen neck glands. °· Chills and a high or persistent fever. °· Severe headache. °· Tenderness over the sinuses. °· Persistent overall ill feeling (malaise), muscle aches, and tiredness (fatigue). °· Persistent cough. °· Yellow, green, or brown mucus production with coughing. °HOME CARE INSTRUCTIONS  °· Only take over-the-counter or prescription medicines for pain, discomfort, diarrhea, or fever as directed by your caregiver. °· Drink enough water and fluids to keep your urine clear or pale yellow. Sports drinks can provide valuable electrolytes, sugars, and hydration. °· Get plenty of rest and maintain proper nutrition. Soups and broths with crackers or rice are fine. °SEEK IMMEDIATE MEDICAL CARE IF:  °· You have severe headaches, shortness of breath, chest pain, neck pain, or an unusual rash. °· You have uncontrolled vomiting, diarrhea, or you are unable to keep down fluids. °· You or your child has an oral temperature above 102° F (38.9° C), not controlled by medicine. °· Your baby is older than 3 months with a rectal temperature of 102° F (38.9° C) or higher. °· Your baby is 3  months old or younger with a rectal temperature of 100.4° F (38° C) or higher. °MAKE SURE YOU:  °· Understand these instructions. °· Will watch your condition. °· Will get help right away if you are not doing well or get worse. °Document Released: 11/07/2004 Document Revised: 04/22/2011 Document Reviewed: 06/04/2010 °ExitCare® Patient Information ©2015 [employer]. This information is not intended to replace advice given to you by your health care provider. Make sure you discuss any questions you have with your health care provider. ° °Pharyngitis °Pharyngitis is redness, pain, and swelling (inflammation) of your pharynx.  °CAUSES  °Pharyngitis is usually caused by infection. Most of the time, these infections are from viruses (viral) and are part of a cold. However, sometimes pharyngitis is caused by bacteria (bacterial). Pharyngitis can also be caused by allergies. Viral pharyngitis may be spread from person to person by coughing, sneezing, and personal items or utensils (cups, forks, spoons, toothbrushes). Bacterial pharyngitis may be spread from person to person by more intimate contact, such as kissing.  °SIGNS AND SYMPTOMS  °Symptoms of pharyngitis include:   °· Sore throat.   °· Tiredness (fatigue).   °· Low-grade fever.   °· Headache. °· Joint pain and muscle aches. °· Skin rashes. °· Swollen lymph nodes. °· Plaque-like film on throat or tonsils (often seen with bacterial pharyngitis). °DIAGNOSIS  °Your health care provider will ask you questions about your illness and your symptoms. Your medical history, along with a physical exam, is often all that is needed to diagnose pharyngitis. Sometimes, a rapid strep test is done. Other lab tests may also be done, depending on the suspected cause.  °TREATMENT  °Viral pharyngitis will usually get better in   3-4 days without the use of medicine. Bacterial pharyngitis is treated with medicines that kill germs (antibiotics).  °HOME CARE INSTRUCTIONS  °· Drink enough  water and fluids to keep your urine clear or pale yellow.   °· Only take over-the-counter or prescription medicines as directed by your health care provider:   °¨ If you are prescribed antibiotics, make sure you finish them even if you start to feel better.   °¨ Do not take aspirin.   °· Get lots of rest.   °· Gargle with 8 oz of salt water (½ tsp of salt per 1 qt of water) as often as every 1-2 hours to soothe your throat.   °· Throat lozenges (if you are not at risk for choking) or sprays may be used to soothe your throat. °SEEK MEDICAL CARE IF:  °· You have large, tender lumps in your neck. °· You have a rash. °· You cough up green, yellow-brown, or bloody spit. °SEEK IMMEDIATE MEDICAL CARE IF:  °· Your neck becomes stiff. °· You drool or are unable to swallow liquids. °· You vomit or are unable to keep medicines or liquids down. °· You have severe pain that does not go away with the use of recommended medicines. °· You have trouble breathing (not caused by a stuffy nose). °MAKE SURE YOU:  °· Understand these instructions. °· Will watch your condition. °· Will get help right away if you are not doing well or get worse. °Document Released: 01/28/2005 Document Revised: 11/18/2012 Document Reviewed: 10/05/2012 °ExitCare® Patient Information ©2015 [employer]. This information is not intended to replace advice given to you by your health care provider. Make sure you discuss any questions you have with your health care provider. ° °

## 2014-07-23 LAB — CULTURE, GROUP A STREP

## 2014-12-05 ENCOUNTER — Other Ambulatory Visit: Payer: Self-pay | Admitting: Family Medicine

## 2014-12-17 ENCOUNTER — Other Ambulatory Visit: Payer: Self-pay | Admitting: Family Medicine

## 2014-12-19 ENCOUNTER — Other Ambulatory Visit: Payer: Self-pay | Admitting: Family Medicine

## 2014-12-19 MED ORDER — AMLODIPINE BESYLATE 5 MG PO TABS: ORAL_TABLET | ORAL | Status: DC

## 2015-01-31 ENCOUNTER — Other Ambulatory Visit: Payer: Self-pay | Admitting: Family Medicine

## 2015-01-31 MED ORDER — AMLODIPINE BESYLATE 5 MG PO TABS: ORAL_TABLET | ORAL | Status: DC

## 2015-03-04 ENCOUNTER — Ambulatory Visit (INDEPENDENT_AMBULATORY_CARE_PROVIDER_SITE_OTHER): Payer: BLUE CROSS/BLUE SHIELD | Admitting: Family Medicine

## 2015-03-04 VITALS — BP 122/90 | HR 92 | Temp 98.2°F | Resp 18 | Ht 71.0 in | Wt 210.0 lb

## 2015-03-04 DIAGNOSIS — Z131 Encounter for screening for diabetes mellitus: Secondary | ICD-10-CM | POA: Diagnosis not present

## 2015-03-04 DIAGNOSIS — I10 Essential (primary) hypertension: Secondary | ICD-10-CM

## 2015-03-04 DIAGNOSIS — Z13220 Encounter for screening for lipoid disorders: Secondary | ICD-10-CM | POA: Diagnosis not present

## 2015-03-04 DIAGNOSIS — Z130 Encounter for screening for diseases of the blood and blood-forming organs and certain disorders involving the immune mechanism: Secondary | ICD-10-CM | POA: Diagnosis not present

## 2015-03-04 LAB — CBC
HEMATOCRIT: 44 % (ref 39.0–52.0)
HEMOGLOBIN: 15.2 g/dL (ref 13.0–17.0)
MCH: 30.3 pg (ref 26.0–34.0)
MCHC: 34.5 g/dL (ref 30.0–36.0)
MCV: 87.6 fL (ref 78.0–100.0)
MPV: 9.5 fL (ref 8.6–12.4)
Platelets: 282 10*3/uL (ref 150–400)
RBC: 5.02 MIL/uL (ref 4.22–5.81)
RDW: 13 % (ref 11.5–15.5)
WBC: 6.8 10*3/uL (ref 4.0–10.5)

## 2015-03-04 LAB — COMPREHENSIVE METABOLIC PANEL
ALBUMIN: 4.8 g/dL (ref 3.6–5.1)
ALT: 23 U/L (ref 9–46)
AST: 16 U/L (ref 10–40)
Alkaline Phosphatase: 98 U/L (ref 40–115)
BILIRUBIN TOTAL: 0.5 mg/dL (ref 0.2–1.2)
BUN: 12 mg/dL (ref 7–25)
CO2: 25 mmol/L (ref 20–31)
Calcium: 9.6 mg/dL (ref 8.6–10.3)
Chloride: 103 mmol/L (ref 98–110)
Creat: 0.79 mg/dL (ref 0.60–1.35)
Glucose, Bld: 88 mg/dL (ref 65–99)
Potassium: 4.3 mmol/L (ref 3.5–5.3)
Sodium: 139 mmol/L (ref 135–146)
TOTAL PROTEIN: 7.5 g/dL (ref 6.1–8.1)

## 2015-03-04 LAB — LIPID PANEL
CHOL/HDL RATIO: 6.1 ratio — AB (ref <=5.0)
Cholesterol: 255 mg/dL — ABNORMAL HIGH (ref 125–200)
HDL: 42 mg/dL (ref >=40)
LDL Cholesterol: 154 mg/dL — ABNORMAL HIGH (ref <130)
TRIGLYCERIDES: 293 mg/dL — AB (ref <150)
VLDL: 59 mg/dL — ABNORMAL HIGH (ref <30)

## 2015-03-04 MED ORDER — AMLODIPINE BESYLATE 5 MG PO TABS: ORAL_TABLET | ORAL | Status: DC

## 2015-03-04 NOTE — Patient Instructions (Signed)
It was good to see you today- I will be in touch with your labs asap Continue your current BP medication

## 2015-03-04 NOTE — Progress Notes (Signed)
[facility] [address] [place] [address] [phone]  Date:  03/04/2015   Name:  Eddie Johnson   DOB:  [birth year]   MRN:  [id number]  PCP:  No PCP Per Patient    Chief Complaint: Medication Refill   History of Present Illness:  Eddie Johnson is a 45 y.o. very pleasant male patient who presents with the following:  Generally healthy except for HTN.  Here today for a recheck and refill. He is on amlodpine for his HTN.  He has not actually run out of this as dr. Conley Rolls refilled it for him  BP Readings from Last 3 Encounters:  03/04/15 158/104  07/19/14 131/82  05/03/14 140/104   At home his BP may run 130- 140/80 He feels good on the norvasc.  He takes 5 mg BID He is fasting today and is ok with having some labs  Patient Active Problem List   Diagnosis Date Noted  . Unspecified essential hypertension 09/01/2013    Past Medical History  Diagnosis Date  . Hypertension     Past Surgical History  Procedure Laterality Date  . Inguinal hernia repair      Social History  Substance Use Topics  . Smoking status: Former Smoker -- 0.80 packs/day    Types: Cigarettes    Quit date: 04/04/2012  . Smokeless tobacco: None  . Alcohol Use: No    Family History  Problem Relation Age of Onset  . Cancer Mother     No Known Allergies  Medication list has been reviewed and updated.  Current Outpatient Prescriptions on File Prior to Visit  Medication Sig Dispense Refill  . acetaminophen (TYLENOL) 500 MG tablet Take 1,000 mg by mouth 2 (two) times daily as needed for mild pain.    Marland Kitchen amLODipine (NORVASC) 5 MG tablet TAKE ONE TABLET BY MOUTH TWICE DAILY  "NO MORE REFILLS WITHOUT OFFICE VISIT " 90 tablet 0  . cetirizine-pseudoephedrine (ALL DAY ALLERGY-D) 5-120 MG per tablet Take 1 tablet by mouth 2 (two) times daily. PATIENT NEEDS OFFICE VISIT FOR ADDITIONAL REFILLS 60 tablet 0  . omeprazole (PRILOSEC) 10 MG capsule Take 10 mg by mouth daily.     . sodium chloride (OCEAN) 0.65 % SOLN nasal spray Place 1 spray into both nostrils as needed for congestion.    . vitamin C (ASCORBIC ACID) 500 MG tablet Take 1,000 mg by mouth daily.      No current facility-administered medications on file prior to visit.    Review of Systems:  As per HPI- otherwise negative.   Physical Examination: Filed Vitals:   03/04/15 1432  BP: 158/104  Pulse: 92  Temp: 98.2 F (36.8 C)  Resp: 18   Filed Vitals:   03/04/15 1432  Height:  (1.803 m)  Weight: 210 lb (95.255 kg)   Body mass index is 29.3 kg/(m^2). Ideal Body Weight: Weight in (lb) to have BMI = 25: 178.9  GEN: WDWN, NAD, Non-toxic, A & O x 3, overweight, looks well HEENT: Atraumatic, Normocephalic. Neck supple. No masses, No LAD. Ears and Nose: No external deformity. CV: RRR, No M/G/R. No JVD. No thrill. No extra heart sounds. PULM: CTA B, no wheezes, crackles, rhonchi. No retractions. No resp. distress. No accessory muscle use. EXTR: No c/c/e NEURO Normal gait.  PSYCH: Normally interactive. Conversant. Not depressed or anxious appearing.  Calm demeanor.    Assessment and Plan: Essential hypertension - Plan: amLODipine (NORVASC) 5 MG tablet  Screening  for diabetes mellitus - Plan: Comprehensive metabolic panel, Hemoglobin A1c  Screening for hyperlipidemia - Plan: Lipid panel  Screening for deficiency anemia - Plan: CBC  Overweight, HTN controlled with medication He is going through a hard time as his mother died in 10-25-16but overall he is ok   Signed Abbe Amsterdam, MD

## 2015-03-05 LAB — HEMOGLOBIN A1C
Hgb A1c MFr Bld: 5.5 % (ref <5.7)
Mean Plasma Glucose: 111 mg/dL (ref <117)

## 2015-03-27 ENCOUNTER — Ambulatory Visit (INDEPENDENT_AMBULATORY_CARE_PROVIDER_SITE_OTHER): Payer: BLUE CROSS/BLUE SHIELD | Admitting: Physician Assistant

## 2015-03-27 VITALS — BP 142/96 | HR 88 | Temp 98.1°F | Resp 18 | Ht 71.65 in | Wt 210.8 lb

## 2015-03-27 DIAGNOSIS — J069 Acute upper respiratory infection, unspecified: Secondary | ICD-10-CM

## 2015-03-27 DIAGNOSIS — B9789 Other viral agents as the cause of diseases classified elsewhere: Principal | ICD-10-CM

## 2015-03-27 MED ORDER — IPRATROPIUM BROMIDE 0.03 % NA SOLN: 2.0000 | Freq: Two times a day (BID) | NASAL | Status: DC

## 2015-03-27 MED ORDER — BENZONATATE 100 MG PO CAPS
100.0000 mg | ORAL_CAPSULE | Freq: Three times a day (TID) | ORAL | Status: DC | PRN
Start: 2015-03-27 — End: 2016-04-22

## 2015-03-27 NOTE — Progress Notes (Signed)
Patient ID: Eddie Johnson, male    DOB: Mar 16, 1970, 45 y.o.   MRN: [id number]  PCP: No PCP Per Patient  Subjective:   Chief Complaint  Patient presents with  . Nasal Congestion    for a couple of days  . Cough  . Fever  . Sore Throat    HPI Presents for evaluation of a cough, sore throat and congestion that began Friday 03/25/15, and has progressively gotten worse. He states his cough is mostly dry with a small amount of green phlegm production but denies hemoptysis. Patient also complains of fatigue, headache, and some rhinorrhea. He reports a fever of about 100.4 but states that "it broke quickly." He denies any chills, dysphagia, or shortness of breath. He stated he tried alka seltzer cold medicine at home but received no relief. He reports working with the public at [employer] and having many sick contacts. Pt does report increased urinary frequency but attributes it to recently starting hypertension medication. Denies any dysuria or increased urgency.    Review of Systems Constitutional: Positive for fever, chills, diaphoresis and fatigue.  HENT: Positive for congestion, rhinorrhea, sinus pressure and sore throat. Negative for ear pain, hearing loss, postnasal drip and trouble swallowing.  Eyes: Negative for visual disturbance.  Respiratory: Positive for cough. Negative for shortness of breath and wheezing.  Cardiovascular: Negative for chest pain, palpitations and leg swelling.  Gastrointestinal: Negative for nausea, vomiting, abdominal pain, diarrhea and constipation.  Genitourinary: Positive for frequency. Negative for dysuria and urgency.  Musculoskeletal: Negative for myalgias.  Allergic/Immunologic: Positive for environmental allergies.  Neurological: Positive for headaches. Negative for weakness.     Patient Active Problem List   Diagnosis Date Noted  . Unspecified essential hypertension 09/01/2013     Prior to Admission medications   Medication Sig Start  Date End Date Taking? Authorizing Provider  acetaminophen (TYLENOL) 500 MG tablet Take 1,000 mg by mouth 2 (two) times daily as needed for mild pain.   Yes Historical Provider, MD  amLODipine (NORVASC) 5 MG tablet TAKE ONE TABLET BY MOUTH TWICE DAILY 03/04/15  Yes Jessica C Copland, MD  cetirizine-pseudoephedrine (ALL DAY ALLERGY-D) 5-120 MG per tablet Take 1 tablet by mouth 2 (two) times daily. PATIENT NEEDS OFFICE VISIT FOR ADDITIONAL REFILLS   Yes Eleanore E Egan, PA-C  omeprazole (PRILOSEC) 10 MG capsule Take 10 mg by mouth daily.   Yes Historical Provider, MD  sodium chloride (OCEAN) 0.65 % SOLN nasal spray Place 1 spray into both nostrils as needed for congestion.   Yes Historical Provider, MD  vitamin C (ASCORBIC ACID) 500 MG tablet Take 1,000 mg by mouth daily.    Yes Historical Provider, MD     No Known Allergies     Objective:  Physical Exam  Constitutional: He is oriented to person, place, and time. He appears well-developed and well-nourished. No distress.  BP 142/96 mmHg  Pulse 88  Temp(Src) 98.1 F (36.7 C)  Resp 18  Ht 5' 11.65" (1.82 m)  Wt 210 lb 12.8 oz (95.618 kg)  BMI 28.87 kg/m2  SpO2 98%   HENT:  Head: Normocephalic and atraumatic.  Right Ear: Hearing, tympanic membrane, external ear and ear canal normal.  Left Ear: Hearing, tympanic membrane, external ear and ear canal normal.  Nose: Mucosal edema (mild) and rhinorrhea (mild) present.  No foreign bodies. Right sinus exhibits no maxillary sinus tenderness and no frontal sinus tenderness. Left sinus exhibits no maxillary sinus tenderness and no frontal sinus tenderness.  Mouth/Throat: Uvula is midline, oropharynx is clear and moist and mucous membranes are normal. Uvula swelling: minimally enlarged, but may be normal for him. No oropharyngeal exudate.  Eyes: Conjunctivae and EOM are normal. Pupils are equal, round, and reactive to light. Right eye exhibits no discharge. Left eye exhibits no discharge. No scleral  icterus.  Neck: Trachea normal, normal range of motion and full passive range of motion without pain. Neck supple. No thyroid mass and no thyromegaly present.  Cardiovascular: Normal rate, regular rhythm and normal heart sounds.   Pulmonary/Chest: Effort normal and breath sounds normal.  Lymphadenopathy:       Head (right side): No submandibular, no tonsillar, no preauricular, no posterior auricular and no occipital adenopathy present.       Head (left side): No submandibular, no tonsillar, no preauricular and no occipital adenopathy present.    He has no cervical adenopathy.       Right: No supraclavicular adenopathy present.       Left: No supraclavicular adenopathy present.  Neurological: He is alert and oriented to person, place, and time. He has normal strength. No cranial nerve deficit or sensory deficit.  Skin: Skin is warm, dry and intact. No rash noted.  Psychiatric: He has a normal mood and affect. His speech is normal and behavior is normal.           Assessment & Plan:   1. Viral URI with cough Counseled on the viral nature of this illness. When advised that he would not be receiving an antibiotic today he became obviously unhappy and stated "I won't be back." - benzonatate (TESSALON) 100 MG capsule; Take 1-2 capsules (100-200 mg total) by mouth 3 (three) times daily as needed for cough.  Dispense: 40 capsule; Refill: 0 - ipratropium (ATROVENT) 0.03 % nasal spray; Place 2 sprays into both nostrils 2 (two) times daily.  Dispense: 30 mL; Refill: 0   Fernande Bras, PA-C Physician Assistant-Certified [facility]

## 2015-03-27 NOTE — Progress Notes (Signed)
Subjective:    Patient ID: Eddie Johnson, male    DOB: 03/30/1970, 45 y.o.   MRN: [id number]  HPI  Eddie Johnson is a 45 year old Caucasian male who presents today with a cough, sore throat and congestion that began Friday 03/25/15, and has progressively gotten worse. He states his cough is mostly dry with a small amount of green phlegm production but denies hemoptysis. Patient also complains of fatigue, headache, and some rhinorrhea. He reports a fever of about 100.4 but states that "it broke quickly." He denies any chills, dysphagia, or shortness of breath. He stated he tried alka seltzer cold medicine at home but received no relief. He reports working with the public at [employer] and having many sick contacts. Pt does report increased urinary frequency but attributes it to recently starting hypertension medication. Denies any dysuria or increased urgency.   No Known Allergies  Prior to Admission medications   Medication Sig Start Date End Date Taking? Authorizing Provider  acetaminophen (TYLENOL) 500 MG tablet Take 1,000 mg by mouth 2 (two) times daily as needed for mild pain.   Yes Historical Provider, MD  amLODipine (NORVASC) 5 MG tablet TAKE ONE TABLET BY MOUTH TWICE DAILY 03/04/15  Yes Jessica C Copland, MD  cetirizine-pseudoephedrine (ALL DAY ALLERGY-D) 5-120 MG per tablet Take 1 tablet by mouth 2 (two) times daily. PATIENT NEEDS OFFICE VISIT FOR ADDITIONAL REFILLS   Yes Eleanore E Egan, PA-C  omeprazole (PRILOSEC) 10 MG capsule Take 10 mg by mouth daily.   Yes Historical Provider, MD  sodium chloride (OCEAN) 0.65 % SOLN nasal spray Place 1 spray into both nostrils as needed for congestion.   Yes Historical Provider, MD  vitamin C (ASCORBIC ACID) 500 MG tablet Take 1,000 mg by mouth daily.    Yes Historical Provider, MD   PMH:  HTN dx 1-2 years ago GERD  PSH, FH, and SH all reviewed with patient and updated in medical record.  Review of Systems  Constitutional: Positive for  fever, chills, diaphoresis and fatigue.  HENT: Positive for congestion, rhinorrhea, sinus pressure and sore throat. Negative for ear pain, hearing loss, postnasal drip and trouble swallowing.   Eyes: Negative for visual disturbance.  Respiratory: Positive for cough. Negative for shortness of breath and wheezing.   Cardiovascular: Negative for chest pain, palpitations and leg swelling.  Gastrointestinal: Negative for nausea, vomiting, abdominal pain, diarrhea and constipation.  Genitourinary: Positive for frequency. Negative for dysuria and urgency.  Musculoskeletal: Negative for myalgias.  Allergic/Immunologic: Positive for environmental allergies.  Neurological: Positive for headaches. Negative for weakness.       Objective:   Physical Exam  Constitutional: He is oriented to person, place, and time. He appears well-developed and well-nourished. No distress.  HENT:  Head: Normocephalic and atraumatic.  Right Ear: Tympanic membrane and external ear normal.  Left Ear: Tympanic membrane and external ear normal.  Nose: Rhinorrhea (Mild) present. Right sinus exhibits no maxillary sinus tenderness. Left sinus exhibits no maxillary sinus tenderness.  Mouth/Throat: Oropharynx is clear and moist. Uvula swelling: Mild uvula enlargement. No oropharyngeal exudate or posterior oropharyngeal erythema.  Eyes: Conjunctivae and EOM are normal. Pupils are equal, round, and reactive to light.  Cardiovascular: Normal rate, regular rhythm, S1 normal and S2 normal.  Exam reveals no gallop and no friction rub.   No murmur heard. Pulmonary/Chest: Breath sounds normal. No accessory muscle usage. No respiratory distress.  Abdominal: Soft. Bowel sounds are normal. There is no splenomegaly. There is no tenderness.  Lymphadenopathy:    He has no cervical adenopathy.       Right: No supraclavicular adenopathy present.       Left: No supraclavicular adenopathy present.  Neurological: He is alert and oriented to  person, place, and time. He has normal strength.  Skin: Skin is warm, dry and intact.  Psychiatric: He has a normal mood and affect. His speech is normal and behavior is normal.    Filed Vitals:   03/27/15 1220  BP: 142/96  Pulse: 88  Temp: 98.1 F (36.7 C)  Resp: 18         Assessment & Plan:  1. Viral URI with cough  His complaints of cough, sore throat, and congestion x4days as well as fever, fatigue, and rhinorrhea are all consistent with a viral upper respiratory infection. Accordingly Eddie Johnson was prescribed benzonatate  PO TID PRN and Ipratropium nasal spray 2 sprays BID. We instructed him to try to get some rest and drink at least 64oz of water everyday. He was offered a prescription for extra strength muconex but denied stating that he had some at home. Due to the likely viral etiology of his URI no antibiotics were prescribed at this time. Upon being told he would receive no antibiotics Eddie Johnson demeanor changed abruptly from a pleasant, open attitude to a very flat, angry affect. When we tried to discuss this further with him, he had no desire to further the conversation. We instructed him to return if his symptoms don't improve or worsen to which he responded "I won't be back."  - benzonatate (TESSALON) 100 MG capsule; Take 1-2 capsules (100-200 mg total) by mouth 3 (three) times daily as needed for cough.  Dispense: 40 capsule; Refill: 0 - ipratropium (ATROVENT) 0.03 % nasal spray; Place 2 sprays into both nostrils 2 (two) times daily.  Dispense: 30 mL; Refill: 0  S. Lysle Dingwall, PA-S [facility]

## 2015-03-27 NOTE — Patient Instructions (Signed)
Get plenty of rest and drink at least 64 ounces of water daily. 

## 2015-03-28 ENCOUNTER — Other Ambulatory Visit: Payer: Self-pay | Admitting: Family Medicine

## 2015-03-28 ENCOUNTER — Encounter: Payer: Self-pay | Admitting: Family Medicine

## 2015-03-28 MED ORDER — AZITHROMYCIN 250 MG PO TABS: ORAL_TABLET | ORAL | Status: DC

## 2015-03-28 NOTE — Telephone Encounter (Signed)
Spoke with patient, he feels worse, feels more in his chest, yellow productive sputum. Low grade temp, will call in azithromycin, he states he does better on this than augmentin. Still has sore throat, sinus pressure.

## 2016-03-11 ENCOUNTER — Other Ambulatory Visit: Payer: Self-pay | Admitting: Family Medicine

## 2016-03-11 DIAGNOSIS — I10 Essential (primary) hypertension: Secondary | ICD-10-CM

## 2016-03-12 ENCOUNTER — Encounter: Payer: Self-pay | Admitting: Physician Assistant

## 2016-03-12 ENCOUNTER — Encounter: Payer: Self-pay | Admitting: Emergency Medicine

## 2016-03-12 ENCOUNTER — Other Ambulatory Visit: Payer: Self-pay | Admitting: Emergency Medicine

## 2016-03-12 DIAGNOSIS — I10 Essential (primary) hypertension: Secondary | ICD-10-CM

## 2016-03-12 MED ORDER — AMLODIPINE BESYLATE 5 MG PO TABS: ORAL_TABLET | ORAL | 0 refills | Status: DC

## 2016-03-12 MED ORDER — AMLODIPINE BESYLATE 5 MG PO TABS: ORAL_TABLET | ORAL | 1 refills | Status: DC

## 2016-03-12 NOTE — Telephone Encounter (Signed)
Meds ordered this encounter  Medications  . amLODipine (NORVASC) 5 MG tablet    Sig: TAKE ONE TABLET BY MOUTH TWICE DAILY    Dispense:  60 tablet    Refill:  1    Order Specific Question:   Supervising Provider    Answer:   Sherren MochaSHAW, EVA N [[id number]]    Patient notified via My Chart.  To follow-up in February.

## 2016-03-12 NOTE — Telephone Encounter (Signed)
Received refill request for amLODipine (NORVASC) 5 MG tablet. Last office visit and refill on 03/04/2015. Sent refill to pharmacy.

## 2016-04-15 ENCOUNTER — Telehealth: Payer: Self-pay | Admitting: General Practice

## 2016-04-15 DIAGNOSIS — I10 Essential (primary) hypertension: Secondary | ICD-10-CM

## 2016-04-15 MED ORDER — AMLODIPINE BESYLATE 5 MG PO TABS: ORAL_TABLET | ORAL | 0 refills | Status: DC

## 2016-04-15 NOTE — Telephone Encounter (Signed)
Pt is needing a refill on his blood pressure meds, and he has an appt on march 12th   Best number [phone]

## 2016-04-22 ENCOUNTER — Ambulatory Visit (INDEPENDENT_AMBULATORY_CARE_PROVIDER_SITE_OTHER): Payer: BLUE CROSS/BLUE SHIELD | Admitting: Physician Assistant

## 2016-04-22 DIAGNOSIS — I10 Essential (primary) hypertension: Secondary | ICD-10-CM | POA: Diagnosis not present

## 2016-04-22 MED ORDER — AMLODIPINE BESYLATE 5 MG PO TABS: ORAL_TABLET | ORAL | 3 refills | Status: DC

## 2016-04-22 NOTE — Progress Notes (Signed)
04/22/2016 11:25 AM   DOB: [birth year] / MRN: [id number]  SUBJECTIVE:  Eddie Johnson is a 46 y.o. male presenting for medication refills of norvasc. He does not miss doses and tells me he has no problems with the medication.  He does not think that he snores and does not have any "problems when he sleeps."  He is a former smoker with a sporadic history, however he denies a history of heavy smoking. Tells me he is always tired but does work 10-12 hours daily 5 days weekly.    There is no immunization history on file for this patient.    He has No Known Allergies.   He  has a past medical history of GERD (gastroesophageal reflux disease) and Hypertension.    He  reports that he quit smoking about 4 years ago. His smoking use included Cigarettes. He has a 12.00 pack-year smoking history. He has never used smokeless tobacco. He reports that he does not drink alcohol or use drugs. He  reports that he does not engage in sexual activity. The patient  has a past surgical history that includes Orchiopexy.  His family history includes COPD in his mother; Cancer in his mother; Emphysema in his mother; Hypertension in his father.  Review of Systems  Eyes: Negative.   Respiratory: Negative for cough and shortness of breath.   Cardiovascular: Negative for chest pain, orthopnea and leg swelling.  Skin: Negative for rash.  Neurological: Negative for headaches.    The problem list and medications were reviewed and updated by myself where necessary and exist elsewhere in the encounter.   OBJECTIVE:  BP 132/88 (BP Location: Right Arm, Patient Position: Sitting, Cuff Size: Small)   Pulse 89   Temp 99.3 F (37.4 C) (Oral)   Resp 18   Ht 5' 11.65" (1.82 m)   Wt 211 lb 6.4 oz (95.9 kg)   SpO2 99%   BMI 28.95 kg/m   Physical Exam  Constitutional: He is oriented to person, place, and time. He appears well-developed and well-nourished. No distress.  Cardiovascular: Normal rate, regular rhythm,  normal heart sounds and intact distal pulses.  Exam reveals no gallop and no friction rub.   No murmur heard. Pulmonary/Chest: Effort normal and breath sounds normal.  Musculoskeletal: Normal range of motion. He exhibits no edema.  Neurological: He is alert and oriented to person, place, and time. He has normal reflexes.  Skin: Skin is warm. He is not diaphoretic.    BP Readings from Last 3 Encounters:  04/22/16 132/88  03/27/15 (!) 142/96  03/04/15 122/90    Lab Results  Component Value Date   CREATININE 0.79 03/04/2015   BUN 12 03/04/2015   NA 139 03/04/2015   K 4.3 03/04/2015   CL 103 03/04/2015   CO2 25 03/04/2015   Lab Results  Component Value Date   TSH 1.122 09/01/2013   Lab Results  Component Value Date   WBC 6.8 03/04/2015   HGB 15.2 03/04/2015   HCT 44.0 03/04/2015   MCV 87.6 03/04/2015   PLT 282 03/04/2015     No results found for this or any previous visit (from the past 72 hour(s)).  No results found.  ASSESSMENT AND PLAN:  Jaspal was seen today for follow-up.  Diagnoses and all orders for this visit:  Essential hypertension: Uncomplicated.  Will see him back in 6 months for a recheck and an annual exam.  -     amLODipine (NORVASC) 5 MG tablet; TAKE ONE  TABLET BY MOUTH TWICE DAILY -     CBC -     CMP14+EGFR -     TSH    The patient is advised to call or return to clinic if he does not see an improvement in symptoms, or to seek the care of the closest emergency department if he worsens with the above plan.   Philis Fendt, MHS, PA-C [facility] 04/22/2016 11:25 AM

## 2016-04-22 NOTE — Patient Instructions (Signed)
     IF you received an x-ray today, you will receive an invoice from [facility]. Please contact [facility] at [phone] with questions or concerns regarding your invoice.   IF you received labwork today, you will receive an invoice from [facility]. Please contact [facility] at [phone] with questions or concerns regarding your invoice.   Our billing staff will not be able to assist you with questions regarding bills from these companies.  You will be contacted with the lab results as soon as they are available. The fastest way to get your results is to activate your My Chart account. Instructions are located on the last page of this paperwork. If you have not heard from us regarding the results in 2 weeks, please contact this office.     

## 2016-04-23 LAB — CMP14+EGFR
A/G RATIO: 2 (ref 1.2–2.2)
ALBUMIN: 4.5 g/dL (ref 3.5–5.5)
ALT: 16 IU/L (ref 0–44)
AST: 14 IU/L (ref 0–40)
Alkaline Phosphatase: 106 IU/L (ref 39–117)
BILIRUBIN TOTAL: 0.3 mg/dL (ref 0.0–1.2)
BUN / CREAT RATIO: 14 (ref 9–20)
BUN: 10 mg/dL (ref 6–24)
CO2: 21 mmol/L (ref 18–29)
Calcium: 9.5 mg/dL (ref 8.7–10.2)
Chloride: 103 mmol/L (ref 96–106)
Creatinine, Ser: 0.74 mg/dL — ABNORMAL LOW (ref 0.76–1.27)
GFR calc non Af Amer: 110 mL/min/{1.73_m2} (ref >59)
GFR, EST AFRICAN AMERICAN: 127 mL/min/{1.73_m2} (ref >59)
GLUCOSE: 107 mg/dL — AB (ref 65–99)
Globulin, Total: 2.3 g/dL (ref 1.5–4.5)
Potassium: 4.5 mmol/L (ref 3.5–5.2)
Sodium: 142 mmol/L (ref 134–144)
Total Protein: 6.8 g/dL (ref 6.0–8.5)

## 2016-04-23 LAB — CBC
HEMOGLOBIN: 14.9 g/dL (ref 13.0–17.7)
Hematocrit: 42.9 % (ref 37.5–51.0)
MCH: 30.6 pg (ref 26.6–33.0)
MCHC: 34.7 g/dL (ref 31.5–35.7)
MCV: 88 fL (ref 79–97)
Platelets: 276 10*3/uL (ref 150–379)
RBC: 4.87 x10E6/uL (ref 4.14–5.80)
RDW: 13.5 % (ref 12.3–15.4)
WBC: 5.9 10*3/uL (ref 3.4–10.8)

## 2016-04-23 LAB — TSH: TSH: 1.02 u[IU]/mL (ref 0.450–4.500)

## 2016-06-10 ENCOUNTER — Other Ambulatory Visit: Payer: Self-pay | Admitting: Family

## 2016-06-10 DIAGNOSIS — I8393 Asymptomatic varicose veins of bilateral lower extremities: Secondary | ICD-10-CM

## 2016-06-11 ENCOUNTER — Other Ambulatory Visit (HOSPITAL_COMMUNITY): Payer: Self-pay | Admitting: Interventional Radiology

## 2016-06-11 DIAGNOSIS — I8393 Asymptomatic varicose veins of bilateral lower extremities: Secondary | ICD-10-CM

## 2016-06-13 ENCOUNTER — Ambulatory Visit
Admission: RE | Admit: 2016-06-13 | Discharge: 2016-06-13 | Disposition: A | Discharge status: Home / Self Care | Payer: BLUE CROSS/BLUE SHIELD | Source: Ambulatory Visit | Attending: Family | Admitting: Family

## 2016-06-13 DIAGNOSIS — I8393 Asymptomatic varicose veins of bilateral lower extremities: Secondary | ICD-10-CM

## 2017-04-02 ENCOUNTER — Ambulatory Visit (INDEPENDENT_AMBULATORY_CARE_PROVIDER_SITE_OTHER): Payer: BLUE CROSS/BLUE SHIELD | Admitting: Physician Assistant

## 2017-04-02 ENCOUNTER — Encounter: Payer: Self-pay | Admitting: Physician Assistant

## 2017-04-02 ENCOUNTER — Other Ambulatory Visit: Payer: Self-pay

## 2017-04-02 VITALS — BP 126/88 | HR 90 | Temp 98.6°F | Resp 20 | Ht 70.0 in | Wt 219.6 lb

## 2017-04-02 DIAGNOSIS — Z130 Encounter for screening for diseases of the blood and blood-forming organs and certain disorders involving the immune mechanism: Secondary | ICD-10-CM | POA: Diagnosis not present

## 2017-04-02 DIAGNOSIS — Z1329 Encounter for screening for other suspected endocrine disorder: Secondary | ICD-10-CM

## 2017-04-02 DIAGNOSIS — Z13228 Encounter for screening for other metabolic disorders: Secondary | ICD-10-CM

## 2017-04-02 DIAGNOSIS — Z0000 Encounter for general adult medical examination without abnormal findings: Secondary | ICD-10-CM

## 2017-04-02 DIAGNOSIS — Z1321 Encounter for screening for nutritional disorder: Secondary | ICD-10-CM | POA: Diagnosis not present

## 2017-04-02 NOTE — Patient Instructions (Signed)
     IF you received an x-ray today, you will receive an invoice from [facility]. Please contact [facility] at [phone] with questions or concerns regarding your invoice.   IF you received labwork today, you will receive an invoice from [facility]. Please contact [facility] at [phone] with questions or concerns regarding your invoice.   Our billing staff will not be able to assist you with questions regarding bills from these companies.  You will be contacted with the lab results as soon as they are available. The fastest way to get your results is to activate your My Chart account. Instructions are located on the last page of this paperwork. If you have not heard from us regarding the results in 2 weeks, please contact this office.     

## 2017-04-02 NOTE — Progress Notes (Signed)
04/02/2017 10:43 AM   DOB: [birth year] / MRN: [id number]  SUBJECTIVE:  Eddie Johnson is a 47 y.o. male presenting for annual exam.  Gradnfather diagnosed with rectal cancer. He occasionally smokes an e-cig 1-2 daily at a low nicotine level. Denies a family history of prostate. He is mostly concerned about his lung health. He smoked 20 years at roughly 1 pack daily.    The natural history of prostate cancer and ongoing controversy regarding screening and potential treatment outcomes of prostate cancer has been discussed with the patient. The meaning of a false positive PSA and a false negative PSA has been discussed. He indicates understanding of the limitations of this screening test and wishes not to proceed with screening PSA testing.  He has a history of a surgical procedure 47 years old which sounds like it was to release the testicle from the abdomen.  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  He lost his partner some years ago secondary to HIV.   Sexual history is guarded due to this and he appears to be emotional given this has happened.   He has No Known Allergies.   He  has a past medical history of GERD (gastroesophageal reflux disease) and Hypertension.    He  reports that he quit smoking about 4 years ago. he has never used smokeless tobacco. He reports that he drinks alcohol. He reports that he does not use drugs. He  reports that he does not engage in sexual activity. The patient  has a past surgical history that includes Orchiopexy.  His family history includes COPD in his mother; Cancer in his mother; Emphysema in his mother; Hypertension in his father.  Review of Systems  Constitutional: Negative for chills, diaphoresis and fever.  Eyes: Negative.   Respiratory: Negative for cough, hemoptysis, sputum production, shortness of breath and wheezing.   Cardiovascular: Negative for chest pain, orthopnea and leg swelling.    Gastrointestinal: Negative for abdominal pain, blood in stool, constipation, diarrhea, heartburn, melena, nausea and vomiting.  Genitourinary: Negative for dysuria, flank pain, frequency, hematuria and urgency.  Skin: Negative for rash.  Neurological: Negative for dizziness, sensory change, speech change, focal weakness and headaches.    The problem list and medications were reviewed and updated by myself where necessary and exist elsewhere in the encounter.    OBJECTIVE:  BP 126/88   Pulse 90   Temp 98.6 F (37 C)   Resp 20   Ht 5\' 10"  (1.778 m)   Wt 219 lb 9.6 oz (99.6 kg)   SpO2 98%   BMI 31.51 kg/m   Physical Exam  Constitutional: He appears well-developed. He is active and cooperative.  Non-toxic appearance.  Cardiovascular: Normal rate, regular rhythm, S1 normal, S2 normal, normal heart sounds, intact distal pulses and normal pulses. Exam reveals no gallop and no friction rub.  No murmur heard. Pulmonary/Chest: Effort normal. No stridor. No tachypnea. No respiratory distress. He has no wheezes. He has no rales.  Abdominal: He exhibits no distension.  Musculoskeletal: He exhibits no edema.  Neurological: He is alert.  Skin: Skin is warm and dry. He is not diaphoretic. No pallor.  Vitals reviewed.   Lab Results  Component Value Date   WBC 5.9 04/22/2016   HGB 14.9 04/22/2016   HCT 42.9 04/22/2016   MCV 88 04/22/2016   PLT 276 04/22/2016    Lab Results  Component Value Date   CREATININE 0.74 (  L) 04/22/2016   BUN 10 04/22/2016   NA 142 04/22/2016   K 4.5 04/22/2016   CL 103 04/22/2016   CO2 21 04/22/2016    Lab Results  Component Value Date   ALT 16 04/22/2016   AST 14 04/22/2016   ALKPHOS 106 04/22/2016   BILITOT 0.3 04/22/2016    Lab Results  Component Value Date   TSH 1.020 04/22/2016    Lab Results  Component Value Date   HGBA1C 5.5 03/04/2015   The 10-year ASCVD risk score Denman George(Goff DC Jr., et al., 2013) is: 5.6%   Values used to calculate  the score:     Age: [age] years     Sex: Male     Is Non-Hispanic African American: No     Diabetic: No     Tobacco smoker: No     Systolic Blood Pressure: 126 mmHg     Is BP treated: Yes     HDL Cholesterol: 42 mg/dL     Total Cholesterol: 255 mg/dL  No results found for this or any previous visit (from the past 72 hour(s)).  No results found.  ASSESSMENT AND PLAN:  Eddie Johnson was seen today for annual exam.  Diagnoses and all orders for this visit:  Annual physical exam  Screening for endocrine, nutritional, metabolic and immunity disorder -     CBC -     Lipid panel -     TSH -     Hemoglobin A1c -     Basic metabolic panel -     Hepatic function panel -     HIV antibody -     RPR -     Hepatitis C antibody -     Care order/instruction:    The patient is advised to call or return to clinic if he does not see an improvement in symptoms, or to seek the care of the closest emergency department if he worsens with the above plan.   Deliah BostonMichael Clark, MHS, PA-C [facility] 04/02/2017 10:43 AM

## 2017-04-03 LAB — BASIC METABOLIC PANEL
BUN / CREAT RATIO: 14 (ref 9–20)
BUN: 11 mg/dL (ref 6–24)
CALCIUM: 9.6 mg/dL (ref 8.7–10.2)
CO2: 22 mmol/L (ref 20–29)
Chloride: 102 mmol/L (ref 96–106)
Creatinine, Ser: 0.79 mg/dL (ref 0.76–1.27)
GFR calc non Af Amer: 106 mL/min/{1.73_m2} (ref >59)
GFR, EST AFRICAN AMERICAN: 123 mL/min/{1.73_m2} (ref >59)
Glucose: 105 mg/dL — ABNORMAL HIGH (ref 65–99)
Potassium: 4.5 mmol/L (ref 3.5–5.2)
Sodium: 143 mmol/L (ref 134–144)

## 2017-04-03 LAB — HEPATIC FUNCTION PANEL
ALK PHOS: 122 IU/L — AB (ref 39–117)
ALT: 28 IU/L (ref 0–44)
AST: 15 IU/L (ref 0–40)
Albumin: 4.8 g/dL (ref 3.5–5.5)
BILIRUBIN, DIRECT: 0.12 mg/dL (ref 0.00–0.40)
Bilirubin Total: 0.3 mg/dL (ref 0.0–1.2)
TOTAL PROTEIN: 7.7 g/dL (ref 6.0–8.5)

## 2017-04-03 LAB — LIPID PANEL
CHOL/HDL RATIO: 6.7 ratio — AB (ref 0.0–5.0)
CHOLESTEROL TOTAL: 263 mg/dL — AB (ref 100–199)
HDL: 39 mg/dL — ABNORMAL LOW (ref >39)
TRIGLYCERIDES: 478 mg/dL — AB (ref 0–149)

## 2017-04-03 LAB — CBC
HEMATOCRIT: 44.1 % (ref 37.5–51.0)
Hemoglobin: 15.1 g/dL (ref 13.0–17.7)
MCH: 30.2 pg (ref 26.6–33.0)
MCHC: 34.2 g/dL (ref 31.5–35.7)
MCV: 88 fL (ref 79–97)
Platelets: 342 10*3/uL (ref 150–379)
RBC: 5 x10E6/uL (ref 4.14–5.80)
RDW: 13.7 % (ref 12.3–15.4)
WBC: 6.3 10*3/uL (ref 3.4–10.8)

## 2017-04-03 LAB — HEMOGLOBIN A1C
Est. average glucose Bld gHb Est-mCnc: 108 mg/dL
Hgb A1c MFr Bld: 5.4 % (ref 4.8–5.6)

## 2017-04-03 LAB — TSH: TSH: 1.15 u[IU]/mL (ref 0.450–4.500)

## 2017-04-03 LAB — RPR: RPR Ser Ql: NONREACTIVE

## 2017-04-03 LAB — HEPATITIS C ANTIBODY: Hep C Virus Ab: 0.1 s/co ratio (ref 0.0–0.9)

## 2017-04-03 LAB — HIV ANTIBODY (ROUTINE TESTING W REFLEX): HIV SCREEN 4TH GENERATION: NONREACTIVE

## 2017-04-16 ENCOUNTER — Other Ambulatory Visit: Payer: Self-pay | Admitting: Physician Assistant

## 2017-04-16 DIAGNOSIS — I10 Essential (primary) hypertension: Secondary | ICD-10-CM

## 2017-04-16 NOTE — Telephone Encounter (Signed)
LOV 04-02-17 with Eddie Johnson / Refill request for amlodipine /

## 2017-04-27 ENCOUNTER — Telehealth: Payer: BLUE CROSS/BLUE SHIELD | Admitting: Family

## 2017-04-27 DIAGNOSIS — R6889 Other general symptoms and signs: Secondary | ICD-10-CM

## 2017-04-27 MED ORDER — OSELTAMIVIR PHOSPHATE 75 MG PO CAPS: 75.0000 mg | ORAL_CAPSULE | Freq: Two times a day (BID) | ORAL | 0 refills | Status: DC

## 2017-04-27 NOTE — Progress Notes (Signed)

## 2017-08-11 ENCOUNTER — Ambulatory Visit (INDEPENDENT_AMBULATORY_CARE_PROVIDER_SITE_OTHER): Payer: BLUE CROSS/BLUE SHIELD

## 2017-08-11 ENCOUNTER — Encounter: Payer: Self-pay | Admitting: Physician Assistant

## 2017-08-11 ENCOUNTER — Ambulatory Visit (INDEPENDENT_AMBULATORY_CARE_PROVIDER_SITE_OTHER): Payer: BLUE CROSS/BLUE SHIELD | Admitting: Physician Assistant

## 2017-08-11 ENCOUNTER — Other Ambulatory Visit: Payer: Self-pay

## 2017-08-11 VITALS — BP 136/85 | HR 94 | Temp 98.6°F | Resp 16 | Ht 70.0 in | Wt 216.8 lb

## 2017-08-11 DIAGNOSIS — M5416 Radiculopathy, lumbar region: Secondary | ICD-10-CM

## 2017-08-11 DIAGNOSIS — I809 Phlebitis and thrombophlebitis of unspecified site: Secondary | ICD-10-CM | POA: Diagnosis not present

## 2017-08-11 DIAGNOSIS — M47816 Spondylosis without myelopathy or radiculopathy, lumbar region: Secondary | ICD-10-CM | POA: Diagnosis not present

## 2017-08-11 DIAGNOSIS — E785 Hyperlipidemia, unspecified: Secondary | ICD-10-CM | POA: Diagnosis not present

## 2017-08-11 DIAGNOSIS — I872 Venous insufficiency (chronic) (peripheral): Secondary | ICD-10-CM

## 2017-08-11 MED ORDER — FUROSEMIDE 20 MG PO TABS: ORAL_TABLET | ORAL | 0 refills | Status: DC

## 2017-08-11 MED ORDER — ATORVASTATIN CALCIUM 10 MG PO TABS: 10.0000 mg | ORAL_TABLET | Freq: Every day | ORAL | 3 refills | Status: AC

## 2017-08-11 NOTE — Progress Notes (Signed)
08/12/2017 12:01 PM   DOB: 01-Oct-1970 / MRN: [id number]  SUBJECTIVE:  Eddie Johnson is a 47 y.o. male presenting for bilateral leg swelling, rash about the medial aspects of the lower legs. States "I have spider veins that become very painful after a long days work."   Symptoms present for about 1 year.  The problem is slowly worsening. He has tried compression stockings with mild relief. His work is very stressful and has a physical component.  The physical demands of his job make his leg swelling worse.   Complains or left thigh numbness about the L2-L3 dermatome. Also worse with physical activity. Does not complain of any significant back pain at this time.   Smoked for about 16 years total. Given lipid panel with mild elevations in LDL coupled with his smoking history he is okay with starting a statin.    He has No Known Allergies.   He  has a past medical history of GERD (gastroesophageal reflux disease) and Hypertension.    He  reports that he quit smoking about 5 years ago. He has never used smokeless tobacco. He reports that he drinks alcohol. He reports that he does not use drugs. He  reports that he does not engage in sexual activity. The patient  has a past surgical history that includes Orchiopexy.  His family history includes COPD in his mother; Cancer in his mother; Emphysema in his mother; Hypertension in his father.  Review of Systems  Constitutional: Negative for fever.  Respiratory: Negative for cough, hemoptysis, sputum production, shortness of breath and wheezing.   Cardiovascular: Positive for leg swelling. Negative for chest pain, palpitations, orthopnea, claudication and PND.  Skin: Negative for itching and rash.  Neurological: Negative for dizziness.    The problem list and medications were reviewed and updated by myself where necessary and exist elsewhere in the encounter.   OBJECTIVE:  BP 136/85   Pulse 94   Temp 98.6 F (37 C)   Resp 16   Ht 5'  10" (1.778 m)   Wt 216 lb 12.8 oz (98.3 kg)   SpO2 96%   BMI 31.11 kg/m   Wt Readings from Last 3 Encounters:  08/11/17 216 lb 12.8 oz (98.3 kg)  04/02/17 219 lb 9.6 oz (99.6 kg)  06/13/16 218 lb (98.9 kg)   Temp Readings from Last 3 Encounters:  08/11/17 98.6 F (37 C)  04/02/17 98.6 F (37 C)  06/13/16 98 F (36.7 C) (Oral)   BP Readings from Last 3 Encounters:  08/11/17 136/85  04/02/17 126/88  06/13/16 140/88   Pulse Readings from Last 3 Encounters:  08/11/17 94  04/02/17 90  06/13/16 80    Physical Exam  Constitutional: He is oriented to person, place, and time. He appears well-developed. He does not appear ill.  Eyes: Pupils are equal, round, and reactive to light. Conjunctivae and EOM are normal.  Cardiovascular: Normal rate.  Pulmonary/Chest: Effort normal.  Abdominal: He exhibits no distension.  Musculoskeletal: Normal range of motion. He exhibits edema (1+ bilaterally. ). He exhibits no tenderness or deformity.  Multiple small varicosities noted about the bilateral legs.   Neurological: He is alert and oriented to person, place, and time. No cranial nerve deficit. Coordination normal.  Skin: Skin is warm and dry. He is not diaphoretic.  Psychiatric: He has a normal mood and affect.  Nursing note and vitals reviewed.   Lab Results  Component Value Date   HGBA1C 5.4 04/02/2017  Lab Results  Component Value Date   WBC 6.3 04/02/2017   HGB 15.1 04/02/2017   HCT 44.1 04/02/2017   MCV 88 04/02/2017   PLT 342 04/02/2017    Lab Results  Component Value Date   CREATININE 0.79 04/02/2017   BUN 11 04/02/2017   NA 143 04/02/2017   K 4.5 04/02/2017   CL 102 04/02/2017   CO2 22 04/02/2017    Lab Results  Component Value Date   ALT 28 04/02/2017   AST 15 04/02/2017   ALKPHOS 122 (H) 04/02/2017   BILITOT 0.3 04/02/2017    Lab Results  Component Value Date   TSH 1.150 04/02/2017    Lab Results  Component Value Date   CHOL 263 (H)  04/02/2017   HDL 39 (L) 04/02/2017   LDLCALC Comment 04/02/2017   TRIG 478 (H) 04/02/2017   CHOLHDL 6.7 (H) 04/02/2017   The 10-year ASCVD risk score Denman George(Goff DC Jr., et al., 2013) is: 7.9%   Values used to calculate the score:     Age: [age] years     Sex: Male     Is Non-Hispanic African American: No     Diabetic: No     Tobacco smoker: No     Systolic Blood Pressure: 136 mmHg     Is BP treated: Yes     HDL Cholesterol: 39 mg/dL     Total Cholesterol: 263 mg/dL   ASSESSMENT AND PLAN:  Eddie Johnson was seen today for leg pain.  Diagnoses and all orders for this visit:  Venous stasis dermatitis of both lower extremities: Patient needs to stop working his current position and tranfer to a less physicall demanding position. He is working with management and they will need about two weeks to help with the transition.  He is out of work until that time.  I will complete FMLA with dates starting from the most recent Saturday to and will see him back on Friday the 12th with the hopes of getting him back to work in a new position on the 13th.  Will try a low dose of lasix to help with swelling and given the phlebitis advised ASA 325 once daily as needed.  -     Ambulatory referral to Vascular Surgery -     furosemide (LASIX) 20 MG tablet; Take 1 tab daily for three days for leg swelling then off for three days.  Lumbar radicular pain: Aleve BID.  -     DG Lumbar Spine Complete; Future  Phlebitis: See problem one.   Dyslipidemia -     atorvastatin (LIPITOR) 10 MG tablet; Take 1 tablet (10 mg total) by mouth daily.    The patient is advised to call or return to clinic if he does not see an improvement in symptoms, or to seek the care of the closest emergency department if he worsens with the above plan.   Deliah BostonMichael Orva Riles, MHS, PA-C [facility] at [facility] 08/12/2017 12:01 PM

## 2017-08-11 NOTE — Patient Instructions (Signed)
You may take one 325 mg aspirin if needed for your legs if they get to hurting.      IF you received an x-ray today, you will receive an [facility] from [facility]. Please contact [facility] at [phone] with questions or concerns regarding your invoice.   IF you received labwork today, you will receive an invoice from [facility]. Please contact [facility] at [phone] with questions or concerns regarding your invoice.   Our billing staff will not be able to assist you with questions regarding bills from these companies.  You will be contacted with the lab results as soon as they are available. The fastest way to get your results is to activate your My Chart account. Instructions are located on the last page of this paperwork. If you have not heard from us regarding the results in 2 weeks, please contact this office.

## 2017-08-17 ENCOUNTER — Encounter: Payer: Self-pay | Admitting: Physician Assistant

## 2017-08-18 ENCOUNTER — Telehealth: Payer: Self-pay | Admitting: Physician Assistant

## 2017-08-18 NOTE — Telephone Encounter (Signed)
Patient needs FMLA forms completed by [name] for his last OV for swelling in his legs. I have completed the forms based off the OV notes and highlighted where they need to be signed I will place the forms in Eddie's box on 08/18/17. Please return to the FMLA/Disability box at the 102 checkout desk within 5-7 business days thank you!

## 2017-08-19 NOTE — Telephone Encounter (Signed)
Paperwork scanned and faxed on 08/19/17

## 2017-08-22 ENCOUNTER — Other Ambulatory Visit: Payer: Self-pay

## 2017-08-22 ENCOUNTER — Ambulatory Visit (INDEPENDENT_AMBULATORY_CARE_PROVIDER_SITE_OTHER): Payer: BLUE CROSS/BLUE SHIELD | Admitting: Physician Assistant

## 2017-08-22 ENCOUNTER — Encounter: Payer: Self-pay | Admitting: Physician Assistant

## 2017-08-22 VITALS — BP 128/92 | HR 95 | Temp 98.4°F | Resp 16 | Ht 70.0 in | Wt 219.2 lb

## 2017-08-22 DIAGNOSIS — I872 Venous insufficiency (chronic) (peripheral): Secondary | ICD-10-CM

## 2017-08-22 DIAGNOSIS — I10 Essential (primary) hypertension: Secondary | ICD-10-CM | POA: Diagnosis not present

## 2017-08-22 DIAGNOSIS — K219 Gastro-esophageal reflux disease without esophagitis: Secondary | ICD-10-CM

## 2017-08-22 NOTE — Progress Notes (Signed)
08/22/2017 5:43 PM   DOB: 1970-09-30 / MRN: [id number]  SUBJECTIVE:  Eddie Johnson is a 47 y.o. male presenting for leg swelling.  Improved but not better.  Still has lots of pain with ambulation. Is scheduled to see vascular. Symptoms present for weeks.  The problem is better. He has tried lasix. Marland Kitchen.  He has No Known Allergies.   He  has a past medical history of GERD (gastroesophageal reflux disease) and Hypertension.    He  reports that he quit smoking about 5 years ago. He has never used smokeless tobacco. He reports that he drinks alcohol. He reports that he does not use drugs. He  reports that he does not engage in sexual activity. The patient  has a past surgical history that includes Orchiopexy.  His family history includes COPD in his mother; Cancer in his mother; Emphysema in his mother; Hypertension in his father.  Review of Systems  Constitutional: Negative for chills, diaphoresis and fever.  Cardiovascular: Positive for leg swelling. Negative for chest pain, claudication and PND.  Gastrointestinal: Negative for nausea.  Skin: Negative for rash.  Neurological: Negative for dizziness.    The problem list and medications were reviewed and updated by myself where necessary and exist elsewhere in the encounter.   OBJECTIVE:  BP (!) 128/92   Pulse 95   Temp 98.4 F (36.9 C)   Resp 16   Ht 5\' 10"  (1.778 m)   Wt 219 lb 3.2 oz (99.4 kg)   SpO2 99%   BMI 31.45 kg/m   Wt Readings from Last 3 Encounters:  08/22/17 219 lb 3.2 oz (99.4 kg)  08/11/17 216 lb 12.8 oz (98.3 kg)  04/02/17 219 lb 9.6 oz (99.6 kg)   Temp Readings from Last 3 Encounters:  08/22/17 98.4 F (36.9 C)  08/11/17 98.6 F (37 C)  04/02/17 98.6 F (37 C)   BP Readings from Last 3 Encounters:  08/22/17 (!) 128/92  08/11/17 136/85  04/02/17 126/88   Pulse Readings from Last 3 Encounters:  08/22/17 95  08/11/17 94  04/02/17 90    Physical Exam  Constitutional: He is oriented to  person, place, and time. He appears well-developed. He does not appear ill.  Eyes: Pupils are equal, round, and reactive to light. Conjunctivae and EOM are normal.  Cardiovascular: Normal rate.  Pulmonary/Chest: Effort normal.  Abdominal: He exhibits no distension.  Musculoskeletal: Normal range of motion. He exhibits no edema, tenderness or deformity.  Neurological: He is alert and oriented to person, place, and time. No cranial nerve deficit. Coordination normal.  Skin: Skin is warm and dry. He is not diaphoretic.  Psychiatric: He has a normal mood and affect.  Nursing note and vitals reviewed.   Lab Results  Component Value Date   HGBA1C 5.4 04/02/2017    Lab Results  Component Value Date   WBC 6.3 04/02/2017   HGB 15.1 04/02/2017   HCT 44.1 04/02/2017   MCV 88 04/02/2017   PLT 342 04/02/2017    Lab Results  Component Value Date   CREATININE 0.79 04/02/2017   BUN 11 04/02/2017   NA 143 04/02/2017   K 4.5 04/02/2017   CL 102 04/02/2017   CO2 22 04/02/2017    Lab Results  Component Value Date   ALT 28 04/02/2017   AST 15 04/02/2017   ALKPHOS 122 (H) 04/02/2017   BILITOT 0.3 04/02/2017    Lab Results  Component Value Date   TSH 1.150 04/02/2017  Lab Results  Component Value Date   CHOL 263 (H) 04/02/2017   HDL 39 (L) 04/02/2017   LDLCALC Comment 04/02/2017   TRIG 478 (H) 04/02/2017   CHOLHDL 6.7 (H) 04/02/2017     ASSESSMENT AND PLAN:  Eddie Johnson was seen today for follow-up.  Diagnoses and all orders for this visit:  Venous stasis dermatitis of both lower extremities: Out another week from work as his legs wont tolerate the physical demands of the job.  RTC in 1 week for clearance.     The patient is advised to call or return to clinic if he does not see an improvement in symptoms, or to seek the care of the closest emergency department if he worsens with the above plan.   Deliah Boston, MHS, PA-C Primary Care at [facility] 08/22/2017 5:43 PM

## 2017-08-29 ENCOUNTER — Encounter: Payer: Self-pay | Admitting: Physician Assistant

## 2017-08-29 ENCOUNTER — Ambulatory Visit: Payer: BLUE CROSS/BLUE SHIELD | Admitting: Physician Assistant

## 2017-08-29 ENCOUNTER — Other Ambulatory Visit: Payer: Self-pay

## 2017-08-29 DIAGNOSIS — I872 Venous insufficiency (chronic) (peripheral): Secondary | ICD-10-CM | POA: Diagnosis not present

## 2017-08-29 MED ORDER — FUROSEMIDE 20 MG PO TABS: ORAL_TABLET | ORAL | 2 refills | Status: AC

## 2017-08-29 NOTE — Progress Notes (Signed)
08/29/2017 2:41 PM   DOB: [birth year] / MRN: [id number]  SUBJECTIVE:  Eddie Johnson is a 47 y.o. male presenting for recheck painful venous stasis. Symptoms present for about 3 weeks.  The problem is improved. He has tried lasix with good relief.     He has No Known Allergies.   He  has a past medical history of GERD (gastroesophageal reflux disease) and Hypertension.    He  reports that he quit smoking about 5 years ago. He has never used smokeless tobacco. He reports that he drinks alcohol. He reports that he does not use drugs. He  reports that he does not engage in sexual activity. The patient  has a past surgical history that includes Orchiopexy.  His family history includes COPD in his mother; Cancer in his mother; Emphysema in his mother; Hypertension in his father.  Review of Systems  Constitutional: Negative for chills, diaphoresis and fever.  Eyes: Negative.   Respiratory: Negative for cough, hemoptysis, sputum production, shortness of breath and wheezing.   Cardiovascular: Negative for chest pain, orthopnea and leg swelling.  Gastrointestinal: Negative for abdominal pain, blood in stool, constipation, diarrhea, heartburn, melena, nausea and vomiting.  Genitourinary: Negative for flank pain.  Skin: Negative for rash.  Neurological: Negative for dizziness, sensory change, speech change, focal weakness and headaches.    The problem list and medications were reviewed and updated by myself where necessary and exist elsewhere in the encounter.   OBJECTIVE:  BP (!) 142/88 (BP Location: Left Arm, Patient Position: Sitting, Cuff Size: Normal)   Pulse (!) 104   Temp 98.2 F (36.8 C) (Oral)   Ht 5\' 11"  (1.803 m)   Wt 222 lb 12.8 oz (101.1 kg)   SpO2 95%   BMI 31.07 kg/m   Wt Readings from Last 3 Encounters:  08/29/17 222 lb 12.8 oz (101.1 kg)  08/22/17 219 lb 3.2 oz (99.4 kg)  08/11/17 216 lb 12.8 oz (98.3 kg)   Temp Readings from Last 3 Encounters:  08/29/17  98.2 F (36.8 C) (Oral)  08/22/17 98.4 F (36.9 C)  08/11/17 98.6 F (37 C)   BP Readings from Last 3 Encounters:  08/29/17 (!) 142/88  08/22/17 (!) 128/92  08/11/17 136/85   Pulse Readings from Last 3 Encounters:  08/29/17 (!) 104  08/22/17 95  08/11/17 94    Physical Exam  Constitutional: He is oriented to person, place, and time. He appears well-developed. He does not appear ill.  Eyes: Pupils are equal, round, and reactive to light. Conjunctivae and EOM are normal.  Cardiovascular: Normal rate.  Pulmonary/Chest: Effort normal.  Abdominal: He exhibits no distension.  Musculoskeletal: Normal range of motion.  Neurological: He is alert and oriented to person, place, and time. No cranial nerve deficit. Coordination normal.  Skin: Skin is warm and dry. He is not diaphoretic.  Psychiatric: He has a normal mood and affect.  Nursing note and vitals reviewed.   Lab Results  Component Value Date   HGBA1C 5.4 04/02/2017    Lab Results  Component Value Date   WBC 6.3 04/02/2017   HGB 15.1 04/02/2017   HCT 44.1 04/02/2017   MCV 88 04/02/2017   PLT 342 04/02/2017    Lab Results  Component Value Date   CREATININE 0.79 04/02/2017   BUN 11 04/02/2017   NA 143 04/02/2017   K 4.5 04/02/2017   CL 102 04/02/2017   CO2 22 04/02/2017    Lab Results  Component Value Date  ALT 28 04/02/2017   AST 15 04/02/2017   ALKPHOS 122 (H) 04/02/2017   BILITOT 0.3 04/02/2017    Lab Results  Component Value Date   TSH 1.150 04/02/2017    Lab Results  Component Value Date   CHOL 263 (H) 04/02/2017   HDL 39 (L) 04/02/2017   LDLCALC Comment 04/02/2017   TRIG 478 (H) 04/02/2017   CHOLHDL 6.7 (H) 04/02/2017     ASSESSMENT AND PLAN:  [name] was seen today for work release.  Diagnoses and all orders for this visit:  Venous stasis dermatitis of both lower extremities Comments: Improving with lasix.  Patient no longer in pain and without swelling.  Will continue current  plan. Back to work without restriction.  Orders: -     furosemide (LASIX) 20 MG tablet; Take 1 tab daily for three days for leg swelling then off for three days.    The patient is advised to call or return to clinic if he does not see an improvement in symptoms, or to seek the care of the closest emergency department if he worsens with the above plan.   Deliah BostonMichael Lavette Yankovich, MHS, PA-C Primary Care at [facility] 08/29/2017 2:41 PM

## 2017-08-29 NOTE — Patient Instructions (Addendum)
[facility] I would recommend you see Dr. Creta LevinStallings.     IF you received an x-ray today, you will receive an invoice from [facility]. Please contact [facility] at [phone] with questions or concerns regarding your invoice.   IF you received labwork today, you will receive an invoice from [facility]. Please contact [facility] at [phone] with questions or concerns regarding your invoice.   Our billing staff will not be able to assist you with questions regarding bills from these companies.  You will be contacted with the lab results as soon as they are available. The fastest way to get your results is to activate your My Chart account. Instructions are located on the last page of this paperwork. If you have not heard from us regarding the results in 2 weeks, please contact this office.

## 2017-09-10 DIAGNOSIS — Z0271 Encounter for disability determination: Secondary | ICD-10-CM

## 2017-09-19 ENCOUNTER — Encounter: Payer: Self-pay | Admitting: Vascular Surgery

## 2017-09-19 ENCOUNTER — Ambulatory Visit (INDEPENDENT_AMBULATORY_CARE_PROVIDER_SITE_OTHER): Payer: BLUE CROSS/BLUE SHIELD | Admitting: Vascular Surgery

## 2017-09-19 ENCOUNTER — Other Ambulatory Visit: Payer: Self-pay

## 2017-09-19 VITALS — BP 137/92 | HR 94 | Temp 97.8°F | Resp 16 | Ht 71.0 in | Wt 223.0 lb

## 2017-09-19 DIAGNOSIS — R600 Localized edema: Secondary | ICD-10-CM

## 2017-09-19 NOTE — Progress Notes (Signed)
Patient ID: Eddie Johnson, male   DOB: September 03, 1970, 47 y.o.   MRN: [id number]  Reason for Consult: New Patient (Initial Visit) (Ref by Retta Mac, PA.  Bilat stasis dermatitis U/S 06/13/2017.)   Referred by Ofilia Neas, PA-C  Subjective:     HPI:  Eddie Johnson is a 47 y.o. male presents for evaluation of bilateral lower extremity swelling and pain with red rash that occurs after he is been standing on his feet for several hours.  He works at [employer] long shifts.  He has been in compression stockings that are knee-high for 3 years.  States that this does help with the swelling but at the end of the day he does develop a dermatologic issue.  This issue was there prior to him wearing compression stockings.  He has been religious about wearing the stockings.  He remains active walks daily.  He is never had a blood clot that he knows about.  He does not have any varicose veins.  He does have some numbness associated with his left leg and has had work-up of back issues for this without any previous back injury.  States that the swelling is very uncomfortable.  He has had venous reflux studies in the past but nothing recently.  Past Medical History:  Diagnosis Date  . GERD (gastroesophageal reflux disease)   . Hypertension    Family History  Problem Relation Age of Onset  . Cancer Mother   . COPD Mother   . Emphysema Mother   . Hypertension Father    Past Surgical History:  Procedure Laterality Date  . ORCHIOPEXY      Short Social History:  Social History   Tobacco Use  . Smoking status: Former Smoker    Last attempt to quit: 06/2011    Years since quitting: 6.2  . Smokeless tobacco: Never Used  Substance Use Topics  . Alcohol use: Yes    Alcohol/week: 0.0 standard drinks    Comment: Once yearly    No Known Allergies  Current Outpatient Medications  Medication Sig Dispense Refill  . acetaminophen (TYLENOL) 500 MG tablet Take 1,000 mg by mouth 2 (two)  times daily as needed for mild pain.    Marland Kitchen amLODipine (NORVASC) 5 MG tablet TAKE 1 TABLET BY MOUTH TWICE DAILY 180 tablet 3  . atorvastatin (LIPITOR) 10 MG tablet Take 1 tablet (10 mg total) by mouth daily. 90 tablet 3  . cetirizine (ZYRTEC) 10 MG chewable tablet Chew 10 mg by mouth daily.    . furosemide (LASIX) 20 MG tablet Take 1 tab daily for three days for leg swelling then off for three days. 30 tablet 2  . omeprazole (PRILOSEC) 10 MG capsule Take 10 mg by mouth daily.    . sodium chloride (OCEAN) 0.65 % SOLN nasal spray Place 1 spray into both nostrils as needed for congestion.    . vitamin C (ASCORBIC ACID) 500 MG tablet Take 1,000 mg by mouth daily.      No current facility-administered medications for this visit.     Review of Systems  Constitutional:  Constitutional negative. HENT: HENT negative.  Eyes: Eyes negative.  Respiratory: Respiratory negative.  Cardiovascular: Positive for leg swelling.  GI: Gastrointestinal negative.  Musculoskeletal: Positive for gait problem and leg pain.  Skin: Positive for rash.  Neurological: Positive for numbness.  Hematologic: Hematologic/lymphatic negative.  Psychiatric: Psychiatric negative.        Objective:  Objective   Vitals:  09/19/17 0910  BP: (!) 137/92  Pulse: 94  Resp: 16  Temp: 97.8 F (36.6 C)  TempSrc: Oral  SpO2: 97%  Weight: 223 lb (101.2 kg)  Height: 5\' 11"  (1.803 m)   Body mass index is 31.1 kg/m.  Physical Exam  Constitutional: He is oriented to person, place, and time. He appears well-developed.  HENT:  Head: Normocephalic.  Eyes: Pupils are equal, round, and reactive to light.  Neck: Normal range of motion.  Cardiovascular: Normal rate.  Pulses:      Radial pulses are 2+ on the right side, and 2+ on the left side.       Femoral pulses are 2+ on the right side, and 2+ on the left side.      Popliteal pulses are 2+ on the right side, and 2+ on the left side.  Pulmonary/Chest: Effort normal.    Abdominal: Soft.  Musculoskeletal: Normal range of motion.  Neurological: He is alert and oriented to person, place, and time.  Skin: Skin is warm and dry.  Psychiatric: He has a normal mood and affect. His behavior is normal. Judgment and thought content normal.    Data: I reviewed his previous DVT study and reflux study which demonstrated neither in May 2018.     Assessment/Plan:     47 year old male presents with bilateral lower extremity edema that occurs after standing on his feet for several hours.  It is associated with pain and dermatitis.  He does wear compression stockings knee-high religiously.  I discussed with him proceeding with reflux testing.  I have also discussed with him elevating his legs when he is recumbent wearing his compression stockings prior to getting out of bed and also transitioning to thigh-high stockings.  We will get him to follow-up in a few weeks with reflux testing here.  The meantime he is safe to work from a vascular standpoint.     Maeola HarmanBrandon Christopher Cain MD [facility]

## 2017-09-22 ENCOUNTER — Other Ambulatory Visit: Payer: Self-pay

## 2017-09-22 DIAGNOSIS — R600 Localized edema: Secondary | ICD-10-CM

## 2017-11-07 ENCOUNTER — Ambulatory Visit: Payer: BLUE CROSS/BLUE SHIELD | Admitting: Vascular Surgery

## 2017-11-07 ENCOUNTER — Inpatient Hospital Stay (HOSPITAL_COMMUNITY): Admission: RE | Admit: 2017-11-07 | Payer: BLUE CROSS/BLUE SHIELD | Source: Ambulatory Visit

## 2017-12-26 ENCOUNTER — Inpatient Hospital Stay (HOSPITAL_COMMUNITY): Admission: RE | Admit: 2017-12-26 | Payer: BLUE CROSS/BLUE SHIELD | Source: Ambulatory Visit

## 2017-12-26 ENCOUNTER — Ambulatory Visit: Payer: BLUE CROSS/BLUE SHIELD | Admitting: Vascular Surgery

## 2018-01-09 ENCOUNTER — Telehealth: Payer: Self-pay | Admitting: Family Medicine

## 2018-01-09 NOTE — Telephone Encounter (Signed)
MyChart msg

## 2018-02-13 ENCOUNTER — Encounter (HOSPITAL_COMMUNITY): Payer: BLUE CROSS/BLUE SHIELD

## 2018-02-13 ENCOUNTER — Ambulatory Visit: Payer: BLUE CROSS/BLUE SHIELD | Admitting: Vascular Surgery

## 2018-03-02 ENCOUNTER — Ambulatory Visit: Payer: BLUE CROSS/BLUE SHIELD | Admitting: Family Medicine

## 2018-03-03 ENCOUNTER — Telehealth: Payer: BLUE CROSS/BLUE SHIELD | Admitting: Physician Assistant

## 2018-03-03 DIAGNOSIS — J111 Influenza due to unidentified influenza virus with other respiratory manifestations: Secondary | ICD-10-CM

## 2018-03-03 DIAGNOSIS — R69 Illness, unspecified: Secondary | ICD-10-CM | POA: Diagnosis not present

## 2018-03-03 MED ORDER — BENZONATATE 200 MG PO CAPS: 200.0000 mg | ORAL_CAPSULE | Freq: Two times a day (BID) | ORAL | 0 refills | Status: DC | PRN

## 2018-03-03 MED ORDER — NAPROXEN 500 MG PO TABS: 500.0000 mg | ORAL_TABLET | Freq: Two times a day (BID) | ORAL | 0 refills | Status: AC

## 2018-03-03 NOTE — Progress Notes (Signed)
E visit for Flu like symptoms   We are sorry that you are not feeling well.  Here is how we plan to help! Based on what you have shared with me it looks like you may have flu-like symptoms that should be watched but do not seem to indicate anti-viral treatment. I am sending in some medications to help you weather your symptoms.  Unfortunately it is a little too late for tamiflu as this medication works best if you take it within 48 hours of symptoms.   Influenza or "the flu" is   an infection caused by a respiratory virus. The flu virus is highly contagious and persons who did not receive their yearly flu vaccination may "catch" the flu from close contact.  We have anti-viral medications to treat the viruses that cause this infection. They are not a "cure" and only shorten the course of the infection. These prescriptions are most effective when they are given within the first 2 days of "flu" symptoms. Antiviral medication are indicated if you have a high risk of complications from the flu. You should also consider an antiviral medication if you are in close contact with someone who is at risk. These medications can help patients avoid complications from the flu  but have side effects that you should know. Possible side effects from Tamiflu or oseltamivir include nausea, vomiting, diarrhea, dizziness, headaches, eye redness, sleep problems or other respiratory symptoms. You should not take Tamiflu if you have an allergy to oseltamivir or any to the ingredients in Tamiflu.  Based upon your symptoms and potential risk factors I recommend that you follow the flu symptoms recommendation that I have listed below.  ANYONE WHO HAS FLU SYMPTOMS SHOULD: Stay home. The flu is highly contagious and going out or to work exposes others!  Be sure to drink plenty of fluids. Water is fine as well as fruit juices, sodas and electrolyte beverages. You may want to stay away from caffeine or alcohol. If you are nauseated,  try taking small sips of liquids. How do you know if you are getting enough fluid? Your urine should be a pale yellow or almost colorless. Get rest. Taking a steamy shower or using a humidifier may help nasal congestion and ease sore throat pain. Using a saline nasal spray works much the same way. Cough drops, hard candies and sore throat lozenges may ease your cough. Line up a caregiver. Have someone check on you regularly.   GET HELP RIGHT AWAY IF: You cannot keep down liquids or your medications. You become short of breath Your fell like you are going to pass out or loose consciousness. Your symptoms persist after you have completed your treatment plan MAKE SURE YOU  Understand these instructions. Will watch your condition. Will get help right away if you are not doing well or get worse.  Your e-visit answers were reviewed by a board certified advanced clinical practitioner to complete your personal care plan.  Depending on the condition, your plan could have included both over the counter or prescription medications.  If there is a problem please reply once you have received a response from your provider.  Your safety is important to [place].  If you have drug allergies check your prescription carefully.    You can use MyChart to ask questions about today's visit, request a non-urgent call back, or ask for a work or school excuse for 24 hours related to this e-Visit. If it has been greater than 24 hours you  will need to follow up with your provider, or enter a new e-Visit to address those concerns.  You will get an e-mail in the next two days asking about your experience.  I hope that your e-visit has been valuable and will speed your recovery. Thank you for using e-visits.   ===View-only below this line===   ----- Message -----    From: Carlyon Shadow    Sent: 03/03/2018 11:30 AM EST      To: E-Visit Mailing List Subject: E-Visit Submission: Flu Like Symptoms  E-Visit  Submission: Flu Like Symptoms --------------------------------  Question: How long have you had flu like symptoms? Answer:   over two days  Question: Are you having any body aches or pain such as Answer:   Muscle pain  Question: Do you have a cough or sore throat? Answer:   I have a cough  Question: Are you in close contact with anyone who has similar symptoms ? Answer:   No  Question: Do you have a fever? Answer:   Yes, I have a low fever (lower than 101 degrees)  Question: Are you short of breath? Answer:   No  Question: Do you have constant pain in your chest or abdomen? Answer:   No  Question: Are you able to keep liquids down? Answer:   Yes  Question: Have you experienced a seizure or loss of consciousness? Answer:   No  Question: Do you have a headache? Answer:   Yes  Question: Is there a rash? Answer:   No  Question: Are you over the age of 20? Answer:   No  Question: Are you treated for any of the following conditions: Asthma, COPD, diabetes, renal failure on dialysis, AIDS, any neuromuscular disease that effects the clearing of secretions heart failure or heart disease? Answer:   No  Question: Have you received recent chemotherapy or are you taking a drug that may reduce your ability to fight infections for psoriasis, arthritis, or any other autoimmune condition? Answer:   No  Question: Do you have close contact with anyone who has been diagnosed with any of the conditions listed above? Answer:   No  Question: Are your symptoms severe enough that you think or someone has told you that you need to see a physician urgently? Answer:   No  Question: Are you allergic to Zanamivir or Oseltamivir (Tamiflu)? Answer:   No  Question: Are there children in your family or household that are under the age of 5? Answer:   No  Question: Please list your medication allergies that you may have ? (If 'none' , please list as 'none') Answer:   none  Question: Please  list any additional comments  Answer:   I have been feeling this way for 2 days now.

## 2018-04-03 ENCOUNTER — Encounter (HOSPITAL_COMMUNITY): Payer: BLUE CROSS/BLUE SHIELD

## 2018-04-03 ENCOUNTER — Ambulatory Visit: Payer: BLUE CROSS/BLUE SHIELD | Admitting: Vascular Surgery

## 2018-04-21 ENCOUNTER — Encounter: Payer: Self-pay | Admitting: Physician Assistant

## 2018-04-22 ENCOUNTER — Other Ambulatory Visit: Payer: Self-pay | Admitting: *Deleted

## 2018-04-22 DIAGNOSIS — I10 Essential (primary) hypertension: Secondary | ICD-10-CM

## 2018-04-22 MED ORDER — AMLODIPINE BESYLATE 5 MG PO TABS: ORAL_TABLET | ORAL | 0 refills | Status: DC

## 2018-04-29 ENCOUNTER — Other Ambulatory Visit: Payer: Self-pay | Admitting: Family Medicine

## 2018-04-29 DIAGNOSIS — I10 Essential (primary) hypertension: Secondary | ICD-10-CM

## 2018-04-29 NOTE — Telephone Encounter (Signed)
Requested medication (s) are due for refill today - patient received RF 04/22/18  Requested medication (s) are on the active medication list -yes  Future visit scheduled -no  Last refill: 04/22/18   Notes to clinic: Patient is requesting a refill- he has been notified in MyChart that he needs to schedule appointment for more refills- sent for review of request.  Requested Prescriptions  Pending Prescriptions Disp Refills   amLODipine (NORVASC) 5 MG tablet [Pharmacy Med Name: AMLODIPINE BESYLATE 5 MG TAB] 30 tablet 0    Sig: TAKE 1 TABLET BY MOUTH TWICE A DAY     Cardiovascular:  Calcium Channel Blockers Failed - 04/29/2018  8:33 AM      Failed - Last BP in normal range    BP Readings from Last 1 Encounters:  09/19/17 (!) 137/92         Failed - Valid encounter within last 6 months    Recent Outpatient Visits          8 months ago Venous stasis dermatitis of both lower extremities   Primary Care at [facility], Marolyn Hammock, PA-C   8 months ago Venous stasis dermatitis of both lower extremities   Primary Care at [facility], Marolyn Hammock, PA-C   8 months ago Venous stasis dermatitis of both lower extremities   [facility] at [facility], Marolyn Hammock, PA-C   1 year ago Annual physical exam   [facility] at [facility], Marolyn Hammock, PA-C   2 years ago Essential hypertension   [facility] at [facility], Marolyn Hammock, PA-C              Requested Prescriptions  Pending Prescriptions Disp Refills   amLODipine (NORVASC) 5 MG tablet [Pharmacy Med Name: AMLODIPINE BESYLATE 5 MG TAB] 30 tablet 0    Sig: TAKE 1 TABLET BY MOUTH TWICE A DAY     Cardiovascular:  Calcium Channel Blockers Failed - 04/29/2018  8:33 AM      Failed - Last BP in normal range    BP Readings from Last 1 Encounters:  09/19/17 (!) 137/92         Failed - Valid encounter within last 6 months    Recent Outpatient Visits          8 months ago Venous stasis dermatitis of both lower extremities   Primary Care at  [facility], Marolyn Hammock, PA-C   8 months ago Venous stasis dermatitis of both lower extremities   Primary Care at [facility], Marolyn Hammock, PA-C   8 months ago Venous stasis dermatitis of both lower extremities   [facility] at [facility], Marolyn Hammock, PA-C   1 year ago Annual physical exam   [facility] at [facility], Marolyn Hammock, PA-C   2 years ago Essential hypertension   Primary Care at [facility], Marolyn Hammock, PA-C

## 2018-05-08 ENCOUNTER — Encounter (HOSPITAL_COMMUNITY): Payer: BLUE CROSS/BLUE SHIELD

## 2018-05-08 ENCOUNTER — Telehealth: Payer: Self-pay | Admitting: Family Medicine

## 2018-05-08 ENCOUNTER — Ambulatory Visit: Payer: BLUE CROSS/BLUE SHIELD | Admitting: Vascular Surgery

## 2018-05-08 NOTE — Telephone Encounter (Signed)
Copied from CRM [id number]. Topic: Quick Communication - Rx Refill/Question >> May 08, 2018  3:49 PM Lyn Hollingshead, [facility] wrote: Medication: amLODipine (NORVASC) 5 MG tablet  Pt called and left message on [facility] mailbox.  States that he tried to get this refilled and pharmacy only gave him 15 day supply, pt needs a 3 month supply. Pt can be reached at [phone]

## 2018-05-09 NOTE — Telephone Encounter (Signed)
Spoke with pt about medication and scheduled an appointment for 05/12/2018.

## 2018-05-11 ENCOUNTER — Telehealth: Payer: Self-pay | Admitting: Family Medicine

## 2018-05-11 ENCOUNTER — Other Ambulatory Visit: Payer: Self-pay

## 2018-05-11 DIAGNOSIS — I10 Essential (primary) hypertension: Secondary | ICD-10-CM

## 2018-05-11 MED ORDER — AMLODIPINE BESYLATE 5 MG PO TABS: ORAL_TABLET | ORAL | 0 refills | Status: DC

## 2018-05-11 NOTE — Telephone Encounter (Signed)
Patient needs refills on BP meds . He has an appt for Friday 05/15/2018. Please do courtest refill for BP meds [facility] [place] [address] [place] . [place]

## 2018-05-12 ENCOUNTER — Telehealth: Payer: Self-pay | Admitting: Family Medicine

## 2018-05-12 NOTE — Telephone Encounter (Signed)
Rx sent yesterday to pharmacy.

## 2018-05-15 ENCOUNTER — Telehealth (INDEPENDENT_AMBULATORY_CARE_PROVIDER_SITE_OTHER): Payer: BLUE CROSS/BLUE SHIELD | Admitting: Family Medicine

## 2018-05-15 ENCOUNTER — Other Ambulatory Visit: Payer: Self-pay

## 2018-05-15 ENCOUNTER — Ambulatory Visit: Payer: Self-pay

## 2018-05-15 DIAGNOSIS — E785 Hyperlipidemia, unspecified: Secondary | ICD-10-CM | POA: Diagnosis not present

## 2018-05-15 DIAGNOSIS — I10 Essential (primary) hypertension: Secondary | ICD-10-CM

## 2018-05-15 MED ORDER — AMLODIPINE BESYLATE 5 MG PO TABS: ORAL_TABLET | ORAL | 1 refills | Status: DC

## 2018-05-15 NOTE — Progress Notes (Signed)
Telemedicine Encounter- SOAP NOTE Established Patient  This telephone encounter was conducted with the patient's (or proxy's) verbal consent via audio telecommunications: yes/no: Yes Patient was instructed to have this encounter in a suitably private space; and to only have persons present to whom they give permission to participate. In addition, patient identity was confirmed by use of name plus two identifiers (DOB and address).  I discussed the limitations, risks, security and privacy concerns of performing an evaluation and management service by telephone and the availability of in person appointments. I also discussed with the patient that there may be a patient responsible charge related to this service. The patient expressed understanding and agreed to proceed.  I spent a total of TIME; 0 MIN TO 60 MIN: 15 minutes talking with the patient or their proxy.  No chief complaint on file.   Subjective   Eddie Johnson is a 48 y.o. established patient. Telephone visit today for  HPI  Hypertension: Patient here for follow-up of elevated blood pressure. He is exercising and is adherent to low salt diet.  Blood pressure is well controlled at home. Cardiac symptoms none. Patient denies chest pain, claudication, exertional chest pressure/discomfort and irregular heart beat.  Cardiovascular risk factors: dyslipidemia, hypertension and male gender. Use of agents associated with hypertension: none. History of target organ damage: none. BP Readings from Last 3 Encounters:  09/19/17 (!) 137/92  08/29/17 (!) 142/88  08/22/17 (!) 128/92   Dyslipidemia: Patient presents for evaluation of lipids.  Compliance with treatment thus far has been good.  A repeat fasting lipid profile was done.  The patient does use medications that may worsen dyslipidemias (corticosteroids, progestins, anabolic steroids, diuretics, beta-blockers, amiodarone, cyclosporine, olanzapine). The patient exercises walking at  work. Works in Scient[employer], research (medical). .  The patient is not known to have coexisting coronary artery disease.   The 10-year ASCVD risk score Mikey Bussing DC Brooke Bonito., et al., 2013) is: 8%   Values used to calculate the score:     Age: 56 years     Sex: Male     Is Non-Hispanic African American: No     Diabetic: No     Tobacco smoker: No     Systolic Blood Pressure: 010 mmHg     Is BP treated: Yes     HDL Cholesterol: 39 mg/dL     Total Cholesterol: 263 mg/dL   Patient Active Problem List   Diagnosis Date Noted  . Unspecified essential hypertension 09/01/2013    Past Medical History:  Diagnosis Date  . GERD (gastroesophageal reflux disease)   . Hypertension     Current Outpatient Medications  Medication Sig Dispense Refill  . acetaminophen (TYLENOL) 500 MG tablet Take 1,000 mg by mouth 2 (two) times daily as needed for mild pain.    Marland Kitchen amLODipine (NORVASC) 5 MG tablet TAKE 1 TABLET BY MOUTH TWICE DAILY 60 tablet 0  . atorvastatin (LIPITOR) 10 MG tablet Take 1 tablet (10 mg total) by mouth daily. 90 tablet 3  . cetirizine (ZYRTEC) 10 MG tablet Take 10 mg by mouth daily.    . furosemide (LASIX) 20 MG tablet Take 1 tab daily for three days for leg swelling then off for three days. 30 tablet 2  . naproxen (NAPROSYN) 500 MG tablet Take 1 tablet (500 mg total) by mouth 2 (two) times daily with a meal. 30 tablet 0  . omeprazole (PRILOSEC) 10 MG capsule Take 10 mg by mouth daily.    . sodium chloride (OCEAN)  0.65 % SOLN nasal spray Place 1 spray into both nostrils as needed for congestion.    . vitamin C (ASCORBIC ACID) 500 MG tablet Take 1,000 mg by mouth daily.      No current facility-administered medications for this visit.     No Known Allergies  Social History   Socioeconomic History  . Marital status: Single    Spouse name: Not on file  . Number of children: Not on file  . Years of education: Not on file  . Highest education level: Not on file  Occupational History  . Not on file  Social  Needs  . Financial resource strain: Not on file  . Food insecurity:    Worry: Not on file    Inability: Not on file  . Transportation needs:    Medical: Not on file    Non-medical: Not on file  Tobacco Use  . Smoking status: Former Smoker    Last attempt to quit: 06/2011    Years since quitting: 6.9  . Smokeless tobacco: Never Used  Substance and Sexual Activity  . Alcohol use: Yes    Alcohol/week: 0.0 standard drinks    Comment: Once yearly  . Drug use: No  . Sexual activity: Never  Lifestyle  . Physical activity:    Days per week: Not on file    Minutes per session: Not on file  . Stress: Not on file  Relationships  . Social connections:    Talks on phone: Not on file    Gets together: Not on file    Attends religious service: Not on file    Active member of club or organization: Not on file    Attends meetings of clubs or organizations: Not on file    Relationship status: Not on file  . Intimate partner violence:    Fear of current or ex partner: Not on file    Emotionally abused: Not on file    Physically abused: Not on file    Forced sexual activity: Not on file  Other Topics Concern  . Not on file  Social History Narrative  . Not on file    ROS Review of Systems  Constitutional: Negative for activity change, appetite change, chills and fever.  HENT: Negative for congestion, nosebleeds, trouble swallowing and voice change.   Respiratory: Negative for cough, shortness of breath and wheezing.   Gastrointestinal: Negative for diarrhea, nausea and vomiting.  Genitourinary: Negative for difficulty urinating, dysuria, flank pain and hematuria.  Musculoskeletal: Negative for back pain, joint swelling and neck pain.  Neurological: Negative for dizziness, speech difficulty, light-headedness and numbness.  See HPI. All other review of systems negative.   Objective   Vitals as reported by the patient: There were no vitals filed for this visit.  Diagnoses and all  orders for this visit:  Essential hypertension-  bp well controlled Pt to return to get labs Will refill amlodipine -     CMP14+EGFR; Future -     Lipid panel; Future -     Microalbumin, urine; Future -     Hemoglobin A1c; Future  Dyslipidemia Pt to return for further labs  Discussed his cholesterol reading and patient is compliant Will reassess -     CMP14+EGFR; Future -     Lipid panel; Future -     Hemoglobin A1c; Future       I discussed the assessment and treatment plan with the patient. The patient was provided an opportunity to ask questions  and all were answered. The patient agreed with the plan and demonstrated an understanding of the instructions.   The patient was advised to call back or seek an in-person evaluation if the symptoms worsen or if the condition fails to improve as anticipated.  I provided 15 minutes of non-face-to-face time during this encounter.  Forrest Moron, MD  Primary Care at [facility]

## 2018-05-19 ENCOUNTER — Encounter: Payer: Self-pay | Admitting: Family Medicine

## 2018-06-04 ENCOUNTER — Other Ambulatory Visit: Payer: Self-pay | Admitting: Family Medicine

## 2018-06-04 DIAGNOSIS — I10 Essential (primary) hypertension: Secondary | ICD-10-CM

## 2018-06-04 NOTE — Telephone Encounter (Signed)
Requested medication (s) are due for refill today:  No  Requested medication (s) are on the active medication list:  Yes   Future visit scheduled:  No   Last Refill: 05/15/18; #30; RF x 1  Note to clinic:  Had Telemedicine visit with Dr. Creta Levin on 05/15/18; progress note not finished, so did not know the plan for f/u on BP/ or refills on Amlodipine  Requested Prescriptions  Pending Prescriptions Disp Refills   amLODipine (NORVASC) 5 MG tablet [Pharmacy Med Name: AMLODIPINE BESYLATE 5 MG TAB] 60 tablet 0    Sig: TAKE 1 TABLET BY MOUTH TWICE A DAY     Cardiovascular:  Calcium Channel Blockers Failed - 06/04/2018 10:32 AM      Failed - Last BP in normal range    BP Readings from Last 1 Encounters:  09/19/17 (!) 137/92         Failed - Valid encounter within last 6 months    Recent Outpatient Visits          9 months ago Venous stasis dermatitis of both lower extremities   Primary Care at [facility], Marolyn Hammock, PA-C   9 months ago Venous stasis dermatitis of both lower extremities   Primary Care at [facility], Marolyn Hammock, PA-C   9 months ago Venous stasis dermatitis of both lower extremities   [facility] at [facility], Marolyn Hammock, PA-C   1 year ago Annual physical exam   [facility] at [facility], Marolyn Hammock, PA-C   2 years ago Essential hypertension   Primary Care at [facility], Marolyn Hammock, PA-C

## 2018-06-28 ENCOUNTER — Other Ambulatory Visit: Payer: Self-pay | Admitting: Family Medicine

## 2018-06-28 DIAGNOSIS — I10 Essential (primary) hypertension: Secondary | ICD-10-CM

## 2018-06-28 NOTE — Telephone Encounter (Signed)
Pt needs OV 

## 2018-07-18 ENCOUNTER — Other Ambulatory Visit: Payer: Self-pay | Admitting: Family Medicine

## 2018-07-18 DIAGNOSIS — I10 Essential (primary) hypertension: Secondary | ICD-10-CM

## 2018-07-18 NOTE — Telephone Encounter (Signed)
Requested Prescriptions  Pending Prescriptions Disp Refills  . amLODipine (NORVASC) 5 MG tablet [Pharmacy Med Name: AMLODIPINE BESYLATE 5 MG TAB] 180 tablet 0    Sig: TAKE 1 TABLET BY MOUTH TWICE A DAY     Cardiovascular:  Calcium Channel Blockers Failed - 07/18/2018  5:32 PM      Failed - Last BP in normal range    BP Readings from Last 1 Encounters:  09/19/17 (!) 137/92         Failed - Valid encounter within last 6 months    Recent Outpatient Visits          10 months ago Venous stasis dermatitis of both lower extremities   Primary Care at [facility], Audrie Lia, PA-C   11 months ago Venous stasis dermatitis of both lower extremities   Primary Care at [facility], Audrie Lia, PA-C   11 months ago Venous stasis dermatitis of both lower extremities   [facility] at [facility], Audrie Lia, PA-C   1 year ago Annual physical exam   Primary Care at [facility], Audrie Lia, PA-C   2 years ago Essential hypertension   Primary Care at [place], PA-C

## 2018-07-22 ENCOUNTER — Other Ambulatory Visit: Payer: Self-pay | Admitting: Family Medicine

## 2018-07-22 DIAGNOSIS — I10 Essential (primary) hypertension: Secondary | ICD-10-CM

## 2018-07-24 ENCOUNTER — Other Ambulatory Visit: Payer: Self-pay

## 2018-07-24 ENCOUNTER — Ambulatory Visit (HOSPITAL_COMMUNITY)
Admission: EM | Admit: 2018-07-24 | Discharge: 2018-07-24 | Disposition: A | Discharge status: Home / Self Care | Payer: BC Managed Care – PPO | Attending: Family Medicine | Admitting: Family Medicine

## 2018-07-24 ENCOUNTER — Ambulatory Visit (INDEPENDENT_AMBULATORY_CARE_PROVIDER_SITE_OTHER): Payer: BC Managed Care – PPO

## 2018-07-24 ENCOUNTER — Encounter (HOSPITAL_COMMUNITY): Payer: Self-pay | Admitting: Family Medicine

## 2018-07-24 DIAGNOSIS — S52531A Colles' fracture of right radius, initial encounter for closed fracture: Secondary | ICD-10-CM

## 2018-07-24 DIAGNOSIS — S52501A Unspecified fracture of the lower end of right radius, initial encounter for closed fracture: Secondary | ICD-10-CM | POA: Diagnosis not present

## 2018-07-24 MED ORDER — HYDROCODONE-ACETAMINOPHEN 5-325 MG PO TABS: 1.0000 | ORAL_TABLET | Freq: Four times a day (QID) | ORAL | 0 refills | Status: DC | PRN

## 2018-07-24 NOTE — Discharge Instructions (Addendum)
Make an appointment with Dr. Nelva Bush

## 2018-07-24 NOTE — ED Provider Notes (Signed)
[facility]    CSN: [id number] Arrival date & time: 07/24/18  1942     History   Chief Complaint Chief Complaint  Patient presents with  . Wrist Pain    HPI Eddie Johnson is a 48 y.o. male.   Initial visit for this 48 yo gentleman with a right wrist injury.  He fell from 10 ft ladder while filling bird feeder this afternoon.  Pain and swelling over distal radius.     Past Medical History:  Diagnosis Date  . GERD (gastroesophageal reflux disease)   . Hypertension     Patient Active Problem List   Diagnosis Date Noted  . Unspecified essential hypertension 09/01/2013    Past Surgical History:  Procedure Laterality Date  . ORCHIOPEXY         Home Medications    Prior to Admission medications   Medication Sig Start Date End Date Taking? Authorizing Provider  acetaminophen (TYLENOL) 500 MG tablet Take 1,000 mg by mouth 2 (two) times daily as needed for mild pain.    [provider]  amLODipine (NORVASC) 5 MG tablet TAKE 1 TABLET BY MOUTH TWICE A DAY 07/18/18   Delia Chimes A, MD  atorvastatin (LIPITOR) 10 MG tablet Take 1 tablet (10 mg total) by mouth daily. 08/11/17   Tereasa Coop, PA-C  cetirizine (ZYRTEC) 10 MG tablet Take 10 mg by mouth daily.    [provider]  furosemide (LASIX) 20 MG tablet Take 1 tab daily for three days for leg swelling then off for three days. 08/29/17   Tereasa Coop, PA-C  HYDROcodone-acetaminophen (NORCO) 5-325 MG tablet Take 1 tablet by mouth every 6 (six) hours as needed for moderate pain. 07/24/18   Robyn Haber, MD  naproxen (NAPROSYN) 500 MG tablet Take 1 tablet (500 mg total) by mouth 2 (two) times daily with a meal. 03/03/18   Tereasa Coop, PA-C  omeprazole (PRILOSEC) 10 MG capsule Take 10 mg by mouth daily.    [provider]  sodium chloride (OCEAN) 0.65 % SOLN nasal spray Place 1 spray into both nostrils as needed for congestion.    [provider]  vitamin C  (ASCORBIC ACID) 500 MG tablet Take 1,000 mg by mouth daily.     [provider]    Family History Family History  Problem Relation Age of Onset  . Cancer Mother   . COPD Mother   . Emphysema Mother   . Hypertension Father     Social History Social History   Tobacco Use  . Smoking status: Former Smoker    Quit date: 06/2011    Years since quitting: 7.1  . Smokeless tobacco: Never Used  Substance Use Topics  . Alcohol use: Yes    Alcohol/week: 0.0 standard drinks    Comment: Once yearly  . Drug use: No     Allergies   Patient has no known allergies.   Review of Systems Review of Systems  All other systems reviewed and are negative.    Physical Exam Triage Vital Signs ED Triage Vitals  Enc Vitals Group     BP      Pulse      Resp      Temp      Temp src      SpO2      Weight      Height      Head Circumference      Peak Flow  Pain Score      Pain Loc      Pain Edu?      Excl. in GC?    No data found.  Updated Vital Signs BP (!) 170/97 (BP Location: Left Arm)   Pulse 92   Temp 98.3 F (36.8 C) (Oral)   Resp 18   SpO2 99%    Physical Exam Vitals signs and nursing note reviewed.  Constitutional:      Appearance: Normal appearance.  Eyes:     Conjunctiva/sclera: Conjunctivae normal.  Neck:     Musculoskeletal: Normal range of motion and neck supple.  Pulmonary:     Effort: Pulmonary effort is normal.  Musculoskeletal:        General: Swelling, tenderness and signs of injury present.     Comments: Pain and swelling right distal radius with tenderness  Skin:    General: Skin is warm and dry.  Neurological:     General: No focal deficit present.     Mental Status: He is alert.  Psychiatric:        Mood and Affect: Mood normal.        Behavior: Behavior normal.      UC Treatments / Results  Labs (all labs ordered are listed, but only abnormal results are displayed) Labs Reviewed - No data to  display  EKG None  Radiology Dg Wrist Complete Right  Result Date: 07/24/2018 CLINICAL DATA:  Fall off ladder, wrist pain, swelling EXAM: RIGHT WRIST - COMPLETE 3+ VIEW COMPARISON:  None. FINDINGS: Diffuse soft tissue swelling. There is a distal radial fracture, nondisplaced. No visible ulnar abnormality. No subluxation or dislocation. IMPRESSION: Nondisplaced distal right radial fracture. Electronically Signed   By: Charlett NoseKevin  Dover M.D.   On: 07/24/2018 20:02    Procedures Procedures (including critical care time)  Medications Ordered in UC Medications - No data to display  Initial Impression / Assessment and Plan / UC Course  I have reviewed the triage vital signs and the nursing notes.  Pertinent labs & imaging results that were available during my care of the patient were reviewed by me and considered in my medical decision making (see chart for details).    Final Clinical Impressions(s) / UC Diagnoses   Final diagnoses:  Closed Colles' fracture of right radius, initial encounter     Discharge Instructions     Make an appointment with Dr. Ethelene Halamos    ED Prescriptions    Medication Sig Dispense Auth. Provider   HYDROcodone-acetaminophen (NORCO) 5-325 MG tablet Take 1 tablet by mouth every 6 (six) hours as needed for moderate pain. 12 tablet Elvina SidleLauenstein, Prerna Harold, MD     Controlled Substance Prescriptions [place] Controlled Substance Registry consulted? Not Applicable   Elvina SidleLauenstein, Jamacia Jester, MD 07/24/18 2014

## 2018-07-24 NOTE — ED Triage Notes (Signed)
Pt here with right wrist pain and deformity after fall today off ladder

## 2018-07-29 DIAGNOSIS — S52591A Other fractures of lower end of right radius, initial encounter for closed fracture: Secondary | ICD-10-CM | POA: Diagnosis not present

## 2018-08-05 DIAGNOSIS — S52591A Other fractures of lower end of right radius, initial encounter for closed fracture: Secondary | ICD-10-CM | POA: Diagnosis not present

## 2018-08-26 DIAGNOSIS — S52591A Other fractures of lower end of right radius, initial encounter for closed fracture: Secondary | ICD-10-CM | POA: Diagnosis not present

## 2018-09-15 DIAGNOSIS — S52591A Other fractures of lower end of right radius, initial encounter for closed fracture: Secondary | ICD-10-CM | POA: Diagnosis not present

## 2018-10-15 ENCOUNTER — Other Ambulatory Visit: Payer: Self-pay | Admitting: Family Medicine

## 2018-10-15 DIAGNOSIS — I10 Essential (primary) hypertension: Secondary | ICD-10-CM

## 2018-10-15 NOTE — Telephone Encounter (Signed)
Forwarding medication refill request to the clinical pool for review. 

## 2018-12-17 ENCOUNTER — Other Ambulatory Visit: Payer: Self-pay | Admitting: Vascular Surgery

## 2018-12-17 DIAGNOSIS — R600 Localized edema: Secondary | ICD-10-CM

## 2019-01-12 ENCOUNTER — Telehealth: Payer: BC Managed Care – PPO | Admitting: Physician Assistant

## 2019-01-12 DIAGNOSIS — J069 Acute upper respiratory infection, unspecified: Secondary | ICD-10-CM

## 2019-01-12 DIAGNOSIS — R05 Cough: Secondary | ICD-10-CM

## 2019-01-12 DIAGNOSIS — R059 Cough, unspecified: Secondary | ICD-10-CM

## 2019-01-12 MED ORDER — FLUTICASONE PROPIONATE 50 MCG/ACT NA SUSP: 2.0000 | Freq: Every day | NASAL | 6 refills | Status: DC

## 2019-01-12 MED ORDER — BENZONATATE 100 MG PO CAPS: 100.0000 mg | ORAL_CAPSULE | Freq: Three times a day (TID) | ORAL | 0 refills | Status: AC | PRN

## 2019-01-12 NOTE — Progress Notes (Signed)
We are sorry you are not feeling well.  Here is how we plan to help! ° °Based on what you have shared with me, it looks like you may have a viral upper respiratory infection.  Upper respiratory infections are caused by a large number of viruses; however, rhinovirus is the most common cause.  ° °Symptoms vary from person to person, with common symptoms including sore throat, cough, fatigue or lack of energy and feeling of general discomfort.  A low-grade fever of up to 100.4 may present, but is often uncommon.  Symptoms vary however, and are closely related to a person's age or underlying illnesses.  The most common symptoms associated with an upper respiratory infection are nasal discharge or congestion, cough, sneezing, headache and pressure in the ears and face.  These symptoms usually persist for about 3 to 10 days, but can last up to 2 weeks.  It is important to know that upper respiratory infections do not cause serious illness or complications in most cases.   ° °Upper respiratory infections can be transmitted from person to person, with the most common method of transmission being a person's hands.  The virus is able to live on the skin and can infect other persons for up to 2 hours after direct contact.  Also, these can be transmitted when someone coughs or sneezes; thus, it is important to cover the mouth to reduce this risk.  To keep the spread of the illness at bay, good hand hygiene is very important. ° °This is an infection that is most likely caused by a virus. There are no specific treatments other than to help you with the symptoms until the infection runs its course.  We are sorry you are not feeling well.  Here is how we plan to help! ° ° °For nasal congestion, you may use an oral decongestants such as Mucinex D or if you have glaucoma or high blood pressure use plain Mucinex.  Saline nasal spray or nasal drops can help and can safely be used as often as needed for congestion.  For your congestion,  I have prescribed Fluticasone nasal spray one spray in each nostril twice a day ° °If you do not have a history of heart disease, hypertension, diabetes or thyroid disease, prostate/bladder issues or glaucoma, you may also use Sudafed to treat nasal congestion.  It is highly recommended that you consult with a pharmacist or your primary care physician to ensure this medication is safe for you to take.    ° °If you have a cough, you may use cough suppressants such as Delsym and Robitussin.  If you have glaucoma or high blood pressure, you can also use Coricidin HBP.   °For cough I have prescribed for you A prescription cough medication called Tessalon Perles 100 mg. You may take 1-2 capsules every 8 hours as needed for cough ° °If you have a sore or scratchy throat, use a saltwater gargle- ¼ to ½ teaspoon of salt dissolved in a 4-ounce to 8-ounce glass of warm water.  Gargle the solution for approximately 15-30 seconds and then spit.  It is important not to swallow the solution.  You can also use throat lozenges/cough drops and Chloraseptic spray to help with throat pain or discomfort.  Warm or cold liquids can also be helpful in relieving throat pain. ° °For headache, pain or general discomfort, you can use Ibuprofen or Tylenol as directed.   °Some authorities believe that zinc sprays or the use of   Echinacea may shorten the course of your symptoms. ° ° °HOME CARE °• Only take medications as instructed by your medical team. °• Be sure to drink plenty of fluids. Water is fine as well as fruit juices, sodas and electrolyte beverages. You may want to stay away from caffeine or alcohol. If you are nauseated, try taking small sips of liquids. How do you know if you are getting enough fluid? Your urine should be a pale yellow or almost colorless. °• Get rest. °• Taking a steamy shower or using a humidifier may help nasal congestion and ease sore throat pain. You can place a towel over your head and breathe in the steam  from hot water coming from a faucet. °• Using a saline nasal spray works much the same way. °• Cough drops, hard candies and sore throat lozenges may ease your cough. °• Avoid close contacts especially the very young and the elderly °• Cover your mouth if you cough or sneeze °• Always remember to wash your hands.  ° °GET HELP RIGHT AWAY IF: °• You develop worsening fever. °• If your symptoms do not improve within 10 days °• You develop yellow or green discharge from your nose over 3 days. °• You have coughing fits °• You develop a severe head ache or visual changes. °• You develop shortness of breath, difficulty breathing or start having chest pain °• Your symptoms persist after you have completed your treatment plan ° °MAKE SURE YOU  °· Understand these instructions. °· Will watch your condition. °· Will get help right away if you are not doing well or get worse. ° °Your e-visit answers were reviewed by a board certified advanced clinical practitioner to complete your personal care plan. Depending upon the condition, your plan could have included both over the counter or prescription medications. °Please review your pharmacy choice. If there is a problem, you may call our nursing hot line at and have the prescription routed to another pharmacy. °Your safety is important to us. If you have drug allergies check your prescription carefully.  ° °You can use MyChart to ask questions about today’s visit, request a non-urgent call back, or ask for a work or school excuse for 24 hours related to this e-Visit. If it has been greater than 24 hours you will need to follow up with your provider, or enter a new e-Visit to address those concerns. °You will get an e-mail in the next two days asking about your experience.  I hope that your e-visit has been valuable and will speed your recovery. Thank you for using e-visits. ° ° ° ° °Greater than 5 minutes, yet less than 10 minutes of time have been spent researching, coordinating  and implementing care for this patient today.  ° °

## 2019-01-13 ENCOUNTER — Other Ambulatory Visit: Payer: Self-pay | Admitting: Family Medicine

## 2019-01-13 DIAGNOSIS — I10 Essential (primary) hypertension: Secondary | ICD-10-CM

## 2019-01-28 ENCOUNTER — Other Ambulatory Visit: Payer: Self-pay | Admitting: Family Medicine

## 2019-01-28 ENCOUNTER — Other Ambulatory Visit: Payer: Self-pay | Admitting: Emergency Medicine

## 2019-01-28 DIAGNOSIS — I10 Essential (primary) hypertension: Secondary | ICD-10-CM

## 2019-01-28 MED ORDER — AMLODIPINE BESYLATE 5 MG PO TABS: 5.0000 mg | ORAL_TABLET | Freq: Two times a day (BID) | ORAL | 0 refills | Status: DC

## 2019-01-28 NOTE — Telephone Encounter (Signed)
Please get patient scheduled for a f/u appt

## 2019-02-01 NOTE — Telephone Encounter (Signed)
Med refill request canceled

## 2019-02-10 ENCOUNTER — Ambulatory Visit: Payer: Self-pay | Admitting: Family Medicine

## 2019-02-24 ENCOUNTER — Ambulatory Visit: Payer: Self-pay | Admitting: Family Medicine

## 2019-02-25 DIAGNOSIS — Z20828 Contact with and (suspected) exposure to other viral communicable diseases: Secondary | ICD-10-CM | POA: Diagnosis not present

## 2019-03-10 ENCOUNTER — Ambulatory Visit: Payer: Self-pay | Admitting: Family Medicine

## 2019-03-24 ENCOUNTER — Ambulatory Visit: Payer: Self-pay | Admitting: Family Medicine

## 2019-04-21 ENCOUNTER — Other Ambulatory Visit: Payer: Self-pay | Admitting: Family Medicine

## 2019-04-21 DIAGNOSIS — I10 Essential (primary) hypertension: Secondary | ICD-10-CM

## 2019-04-21 NOTE — Telephone Encounter (Signed)
Requested Prescriptions  Pending Prescriptions Disp Refills  . amLODipine (NORVASC) 5 MG tablet [Pharmacy Med Name: AMLODIPINE BESYLATE 5 MG TAB] 60 tablet 0    Sig: TAKE 1 TABLET BY MOUTH TWICE A DAY     Cardiovascular:  Calcium Channel Blockers Failed - 04/21/2019  1:35 AM      Failed - Last BP in normal range    BP Readings from Last 1 Encounters:  07/24/18 (!) 170/97         Failed - Valid encounter within last 6 months    Recent Outpatient Visits          11 months ago Essential hypertension   Primary Care at [facility], Manus Rudd, MD   1 year ago Venous stasis dermatitis of both lower extremities   Primary Care at [facility], Marolyn Hammock, PA-C   1 year ago Venous stasis dermatitis of both lower extremities   [facility] at [facility], Marolyn Hammock, PA-C   1 year ago Venous stasis dermatitis of both lower extremities   [facility] at [facility], Marolyn Hammock, PA-C   2 years ago Annual physical exam   Primary Care at [facility], Marolyn Hammock, PA-C      Future Appointments            In 4 weeks Doristine Bosworth, MD Primary Care at [place], [facility]

## 2019-04-28 DIAGNOSIS — R351 Nocturia: Secondary | ICD-10-CM | POA: Diagnosis not present

## 2019-04-28 DIAGNOSIS — I119 Hypertensive heart disease without heart failure: Secondary | ICD-10-CM | POA: Diagnosis not present

## 2019-04-28 DIAGNOSIS — Z0000 Encounter for general adult medical examination without abnormal findings: Secondary | ICD-10-CM | POA: Diagnosis not present

## 2019-04-28 DIAGNOSIS — Z6832 Body mass index (BMI) 32.0-32.9, adult: Secondary | ICD-10-CM | POA: Diagnosis not present

## 2019-04-28 DIAGNOSIS — R21 Rash and other nonspecific skin eruption: Secondary | ICD-10-CM | POA: Diagnosis not present

## 2019-05-12 DIAGNOSIS — I119 Hypertensive heart disease without heart failure: Secondary | ICD-10-CM | POA: Diagnosis not present

## 2019-05-12 DIAGNOSIS — E7800 Pure hypercholesterolemia, unspecified: Secondary | ICD-10-CM | POA: Diagnosis not present

## 2019-05-19 ENCOUNTER — Encounter: Payer: Self-pay | Admitting: Family Medicine

## 2019-05-23 ENCOUNTER — Other Ambulatory Visit: Payer: Self-pay | Admitting: Family Medicine

## 2019-05-23 DIAGNOSIS — I10 Essential (primary) hypertension: Secondary | ICD-10-CM

## 2019-06-09 DIAGNOSIS — I119 Hypertensive heart disease without heart failure: Secondary | ICD-10-CM | POA: Diagnosis not present

## 2019-07-21 DIAGNOSIS — I80231 Phlebitis and thrombophlebitis of right tibial vein: Secondary | ICD-10-CM | POA: Diagnosis not present

## 2019-07-21 DIAGNOSIS — I119 Hypertensive heart disease without heart failure: Secondary | ICD-10-CM | POA: Diagnosis not present

## 2019-08-04 DIAGNOSIS — I119 Hypertensive heart disease without heart failure: Secondary | ICD-10-CM | POA: Diagnosis not present

## 2019-12-31 ENCOUNTER — Other Ambulatory Visit: Payer: BC Managed Care – PPO

## 2019-12-31 DIAGNOSIS — Z20822 Contact with and (suspected) exposure to covid-19: Secondary | ICD-10-CM

## 2020-01-01 LAB — SARS-COV-2, NAA 2 DAY TAT

## 2020-01-01 LAB — NOVEL CORONAVIRUS, NAA: SARS-CoV-2, NAA: NOT DETECTED

## 2020-02-14 ENCOUNTER — Other Ambulatory Visit: Payer: Self-pay

## 2020-02-14 ENCOUNTER — Emergency Department (HOSPITAL_COMMUNITY): Payer: BC Managed Care – PPO

## 2020-02-14 ENCOUNTER — Emergency Department (HOSPITAL_COMMUNITY)
Admission: EM | Admit: 2020-02-14 | Discharge: 2020-02-15 | Disposition: A | Discharge status: Home / Self Care | Payer: BC Managed Care – PPO | Attending: Emergency Medicine | Admitting: Emergency Medicine

## 2020-02-14 DIAGNOSIS — U071 COVID-19: Secondary | ICD-10-CM | POA: Diagnosis not present

## 2020-02-14 DIAGNOSIS — Z87891 Personal history of nicotine dependence: Secondary | ICD-10-CM | POA: Insufficient documentation

## 2020-02-14 DIAGNOSIS — Z79899 Other long term (current) drug therapy: Secondary | ICD-10-CM | POA: Insufficient documentation

## 2020-02-14 DIAGNOSIS — R0981 Nasal congestion: Secondary | ICD-10-CM | POA: Diagnosis present

## 2020-02-14 DIAGNOSIS — I10 Essential (primary) hypertension: Secondary | ICD-10-CM | POA: Insufficient documentation

## 2020-02-14 LAB — BASIC METABOLIC PANEL
Anion gap: 15 (ref 5–15)
BUN: 13 mg/dL (ref 6–20)
CO2: 21 mmol/L — ABNORMAL LOW (ref 22–32)
Calcium: 8.5 mg/dL — ABNORMAL LOW (ref 8.9–10.3)
Chloride: 99 mmol/L (ref 98–111)
Creatinine, Ser: 0.84 mg/dL (ref 0.61–1.24)
GFR, Estimated: >60 mL/min (ref >60)
Glucose, Bld: 114 mg/dL — ABNORMAL HIGH (ref 70–99)
Potassium: 4 mmol/L (ref 3.5–5.1)
Sodium: 135 mmol/L (ref 135–145)

## 2020-02-14 LAB — CBC
HCT: 42.4 % (ref 39.0–52.0)
Hemoglobin: 14.4 g/dL (ref 13.0–17.0)
MCH: 30.3 pg (ref 26.0–34.0)
MCHC: 34 g/dL (ref 30.0–36.0)
MCV: 89.1 fL (ref 80.0–100.0)
Platelets: 168 10*3/uL (ref 150–400)
RBC: 4.76 MIL/uL (ref 4.22–5.81)
RDW: 13.2 % (ref 11.5–15.5)
WBC: 6.7 10*3/uL (ref 4.0–10.5)
nRBC: 0 % (ref 0.0–0.2)

## 2020-02-14 LAB — RESP PANEL BY RT-PCR (FLU A&B, COVID) ARPGX2
Influenza A by PCR: NEGATIVE
Influenza B by PCR: NEGATIVE
SARS Coronavirus 2 by RT PCR: POSITIVE — AB

## 2020-02-14 MED ORDER — ACETAMINOPHEN 500 MG PO TABS
1000.0000 mg | ORAL_TABLET | Freq: Once | ORAL | Status: AC
  Administered 2020-02-14: 1000 mg via ORAL
  Filled 2020-02-14: qty 2

## 2020-02-14 NOTE — ED Notes (Signed)
Pt came up to NT stating he felt like he was going to pass out. Pt was placed in wheelchair and NT retook pts VS. Triage RN made aware.

## 2020-02-14 NOTE — ED Triage Notes (Signed)
Pt from home for eval of shob, fever, body aches, and nasal congestion x 1 week.

## 2020-02-15 ENCOUNTER — Other Ambulatory Visit: Payer: Self-pay

## 2020-02-15 NOTE — ED Notes (Signed)
Gave pt discharge instructions. Pt all set for d/c

## 2020-02-15 NOTE — Discharge Instructions (Addendum)
You have an infection due to Covid-19.  You have been sick about 10 days, and should be improving soon.  It is important to get plenty of rest and drink a lot of fluids, especially water.  Try to eat 3 meals a day if you can.  Use Tylenol for fever.  It is important to wear a mask whenever you are around anyone else, to prevent spreading the Covid infection.  Get an oxygen monitor from the drugstore, to check your oxygen level.  If the level is below 86%, try to take 10-15 deep breaths and see if you can get it to go over 90%.  If it does you are okay to stay at home.  If you are unable to get your oxygen level above 86%, return to the emergency department.

## 2020-02-15 NOTE — ED Provider Notes (Signed)
[facility] EMERGENCY DEPARTMENT Provider Note   CSN: [id number] Arrival date & time: 02/14/20  1647     History Chief Complaint  Patient presents with  . Shortness of Breath    Eddie Johnson is a 50 y.o. male.  HPI He complains of gradually worse symptoms beginning with sinus congestion, 10 days ago.  He complains of shortness of breath that is worse with exertion.  He is able to eat, but has lost his sense of taste.  He is taking his usual prescribed medications.  He has not been vaccinated against COVID-19.  He lives alone.  There are no other known modifying factors.    Past Medical History:  Diagnosis Date  . GERD (gastroesophageal reflux disease)   . Hypertension     Patient Active Problem List   Diagnosis Date Noted  . Unspecified essential hypertension 09/01/2013    Past Surgical History:  Procedure Laterality Date  . ORCHIOPEXY         Family History  Problem Relation Age of Onset  . Cancer Mother   . COPD Mother   . Emphysema Mother   . Hypertension Father     Social History   Tobacco Use  . Smoking status: Former Smoker    Quit date: 06/2011    Years since quitting: 8.6  . Smokeless tobacco: Never Used  Substance Use Topics  . Alcohol use: Yes    Alcohol/week: 0.0 standard drinks    Comment: Once yearly  . Drug use: No    Home Medications Prior to Admission medications   Medication Sig Start Date End Date Taking? Authorizing Provider  acetaminophen (TYLENOL) 500 MG tablet Take 1,000 mg by mouth 2 (two) times daily as needed for mild pain.    [provider]  amLODipine (NORVASC) 5 MG tablet TAKE 1 TABLET BY MOUTH TWICE A DAY 04/21/19   Collie Siad A, MD  atorvastatin (LIPITOR) 10 MG tablet Take 1 tablet (10 mg total) by mouth daily. 08/11/17   Ofilia Neas, PA-C  benzonatate (TESSALON) 100 MG capsule Take 1-2 capsules (100-200 mg total) by mouth 3 (three) times daily as needed for cough. 01/12/19   McVey,  Madelaine Bhat, PA-C  cetirizine (ZYRTEC) 10 MG tablet Take 10 mg by mouth daily.    [provider]  fluticasone (FLONASE) 50 MCG/ACT nasal spray Place 2 sprays into both nostrils daily. 01/12/19   McVey, Madelaine Bhat, PA-C  furosemide (LASIX) 20 MG tablet Take 1 tab daily for three days for leg swelling then off for three days. 08/29/17   Ofilia Neas, PA-C  HYDROcodone-acetaminophen (NORCO) 5-325 MG tablet Take 1 tablet by mouth every 6 (six) hours as needed for moderate pain. 07/24/18   Elvina Sidle, MD  naproxen (NAPROSYN) 500 MG tablet Take 1 tablet (500 mg total) by mouth 2 (two) times daily with a meal. 03/03/18   Ofilia Neas, PA-C  omeprazole (PRILOSEC) 10 MG capsule Take 10 mg by mouth daily.    [provider]  sodium chloride (OCEAN) 0.65 % SOLN nasal spray Place 1 spray into both nostrils as needed for congestion.    [provider]  vitamin C (ASCORBIC ACID) 500 MG tablet Take 1,000 mg by mouth daily.     [provider]    Allergies    Patient has no known allergies.  Review of Systems   Review of Systems  All other systems reviewed and are negative.   Physical Exam Updated  Vital Signs BP (!) 147/90   Pulse 88   Temp 99.7 F (37.6 C) (Oral)   Resp (!) 22   SpO2 92%   Physical Exam Vitals and nursing note reviewed.  Constitutional:      General: He is not in acute distress.    Appearance: He is well-developed and well-nourished. He is ill-appearing. He is not toxic-appearing or diaphoretic.  HENT:     Head: Normocephalic and atraumatic.     Right Ear: External ear normal.     Left Ear: External ear normal.  Eyes:     Extraocular Movements: EOM normal.     Conjunctiva/sclera: Conjunctivae normal.     Pupils: Pupils are equal, round, and reactive to light.  Neck:     Trachea: Phonation normal.  Cardiovascular:     Rate and Rhythm: Normal rate.  Pulmonary:     Effort: Pulmonary effort is normal. No  respiratory distress.     Breath sounds: No stridor.     Comments: Mild tachypnea.  No increased work of breathing. Chest:     Chest wall: No bony tenderness.  Abdominal:     General: There is no distension.     Palpations: Abdomen is soft.     Tenderness: There is no abdominal tenderness.  Musculoskeletal:        General: No swelling or tenderness. Normal range of motion.     Cervical back: Normal range of motion and neck supple.  Skin:    General: Skin is warm, dry and intact.  Neurological:     Mental Status: He is alert and oriented to person, place, and time.     Cranial Nerves: No cranial nerve deficit.     Sensory: No sensory deficit.     Motor: No abnormal muscle tone.     Coordination: Coordination normal.  Psychiatric:        Mood and Affect: Mood and affect and mood normal.        Behavior: Behavior normal.        Thought Content: Thought content normal.        Judgment: Judgment normal.     ED Results / Procedures / Treatments   Labs (all labs ordered are listed, but only abnormal results are displayed) Labs Reviewed  RESP PANEL BY RT-PCR (FLU A&B, COVID) ARPGX2 - Abnormal; Notable for the following components:      Result Value   SARS Coronavirus 2 by RT PCR POSITIVE (*)    All other components within normal limits  BASIC METABOLIC PANEL - Abnormal; Notable for the following components:   CO2 21 (*)    Glucose, Bld 114 (*)    Calcium 8.5 (*)    All other components within normal limits  CBC    EKG EKG Interpretation  Date/Time:  Monday February 14 2020 16:52:24 EST Ventricular Rate:  105 PR Interval:  138 QRS Duration: 84 QT Interval:  326 QTC Calculation: 430 R Axis:   37 Text Interpretation: Sinus tachycardia Nonspecific ST abnormality Abnormal ECG Since last tracing rate faster Otherwise no significant change Confirmed by Mancel Bale [phone]) on 02/15/2020 8:41:53 AM   Radiology DG Chest Portable 1 View  Result Date: 02/14/2020 CLINICAL DATA:   51 year old male with shortness of breath EXAM: PORTABLE CHEST 1 VIEW COMPARISON:  Chest radiograph dated 07/19/2014 FINDINGS: Diffuse chronic interstitial coarsening and bronchitic changes. No focal consolidation, pleural effusion or pneumothorax. The cardiac silhouette is within limits. No acute osseous pathology. IMPRESSION: No active disease.  Electronically Signed   By: Anner Crete M.D.   On: 02/14/2020 17:37    Procedures Procedures (including critical care time)  Medications Ordered in ED Medications  acetaminophen (TYLENOL) tablet 1,000 mg (1,000 mg Oral Given 02/14/20 1700)    ED Course  I have reviewed the triage vital signs and the nursing notes.  Pertinent labs & imaging results that were available during my care of the patient were reviewed by me and considered in my medical decision making (see chart for details).    MDM Rules/Calculators/A&P                           Patient Vitals for the past 24 hrs:  BP Temp Temp src Pulse Resp SpO2  02/15/20 0830 (!) 147/90 -- -- 88 (!) 22 92 %  02/15/20 0800 (!) 156/88 -- -- 94 (!) 27 91 %  02/15/20 0547 (!) 156/76 -- -- 99 18 94 %  02/15/20 0243 (!) 162/83 99.7 F (37.6 C) Oral (!) 105 (!) 22 96 %  02/14/20 2335 (!) 169/85 99.4 F (37.4 C) Oral (!) 108 (!) 24 97 %  02/14/20 2315 (!) 153/91 -- -- (!) 103 18 95 %  02/14/20 2224 -- 100.3 F (37.9 C) Oral -- -- --  02/14/20 2016 (!) 154/87 -- -- 99 20 96 %  02/14/20 1701 (!) 165/88 (!) 102.1 F (38.9 C) Oral (!) 106 20 95 %    9:18 AM Reevaluation with update and discussion. After initial assessment and treatment, an updated evaluation reveals no change in clinical status.  Findings discussed and questions answered. Eddie Johnson   Medical Decision Making:  This patient is presenting for evaluation of worsening Covid infection, which does require a range of treatment options, and is a complaint that involves a moderate risk of morbidity and mortality. The differential  diagnoses include mild versus severe Covid infection. I decided to review old records, and in summary unvaccinated patient presenting with Covid infection and worsening symptoms.  I do not require additional historical information from anyone.  Clinical Laboratory Tests Ordered, included CBC, Metabolic panel and Viral panel. Review indicates Covid positive, flu negative, electrolytes reassuring, CBC normal. Radiologic Tests Ordered, included chest x-ray.  I independently Visualized: Radiographic images, which show no infiltrate or edema  Cardiac Monitor Tracing which shows borderline sinus tachycardia    Critical Interventions-clinical evaluation, laboratory testing, chest x-ray, observation, consultation with pharmacy regarding appropriate treatment for this patient, who is seen day 10 of illness with Covid infection  After These Interventions, the Patient was reevaluated and was found to be a good candidate for discharge from the emergency department.  Patient without oxygen desaturation below 90%, improved with deep breathing, and is generally stable.  He does not meet criteria for hospitalization at this time.  There are no additional treatments available through the emergency department such as monoclonal antibody.  This also was not available an outpatient, at this time.  Therefore the patient is being discharged with symptomatic treatment.  He is given instructions to return for shortness of breath and hypoxia.   Eddie Johnson was evaluated in Emergency Department on 02/15/2020 for the symptoms described in the history of present illness. He was evaluated in the context of the global COVID-19 pandemic, which necessitated consideration that the patient might be at risk for infection with the SARS-CoV-2 virus that causes COVID-19. Institutional protocols and algorithms that pertain to the evaluation of patients at risk  for COVID-19 are in a state of rapid change based on information  released by regulatory bodies including the CDC and federal and state organizations. These policies and algorithms were followed during the patient's care in the ED.  CRITICAL CARE-no Performed by: Mancel Bale  Nursing Notes Reviewed/ Care Coordinated Applicable Imaging Reviewed Interpretation of Laboratory Data incorporated into ED treatment  The patient appears reasonably screened and/or stabilized for discharge and I doubt any other medical condition or other [facility] requiring further screening, evaluation, or treatment in the ED at this time prior to discharge.  Plan: Home Medications-continue current medication; Home Treatments-rest, fluids, monitor oxygen level; return here if the recommended treatment, does not improve the symptoms; Recommended follow up-PCP or return here if needed for hypoxia and shortness of breath     Final Clinical Impression(s) / ED Diagnoses Final diagnoses:  COVID-19 virus infection    Rx / DC Orders ED Discharge Orders    None       Mancel Bale, MD 02/15/20 [phone]

## 2020-10-05 IMAGING — DX RIGHT WRIST - COMPLETE 3+ VIEW
4 series · 4 of 4 positions shown · non-contrast
Comparison: None.

CLINICAL DATA: Fall off ladder, wrist pain, swelling

EXAM:
RIGHT WRIST - COMPLETE 3+ VIEW

[wrist pa]
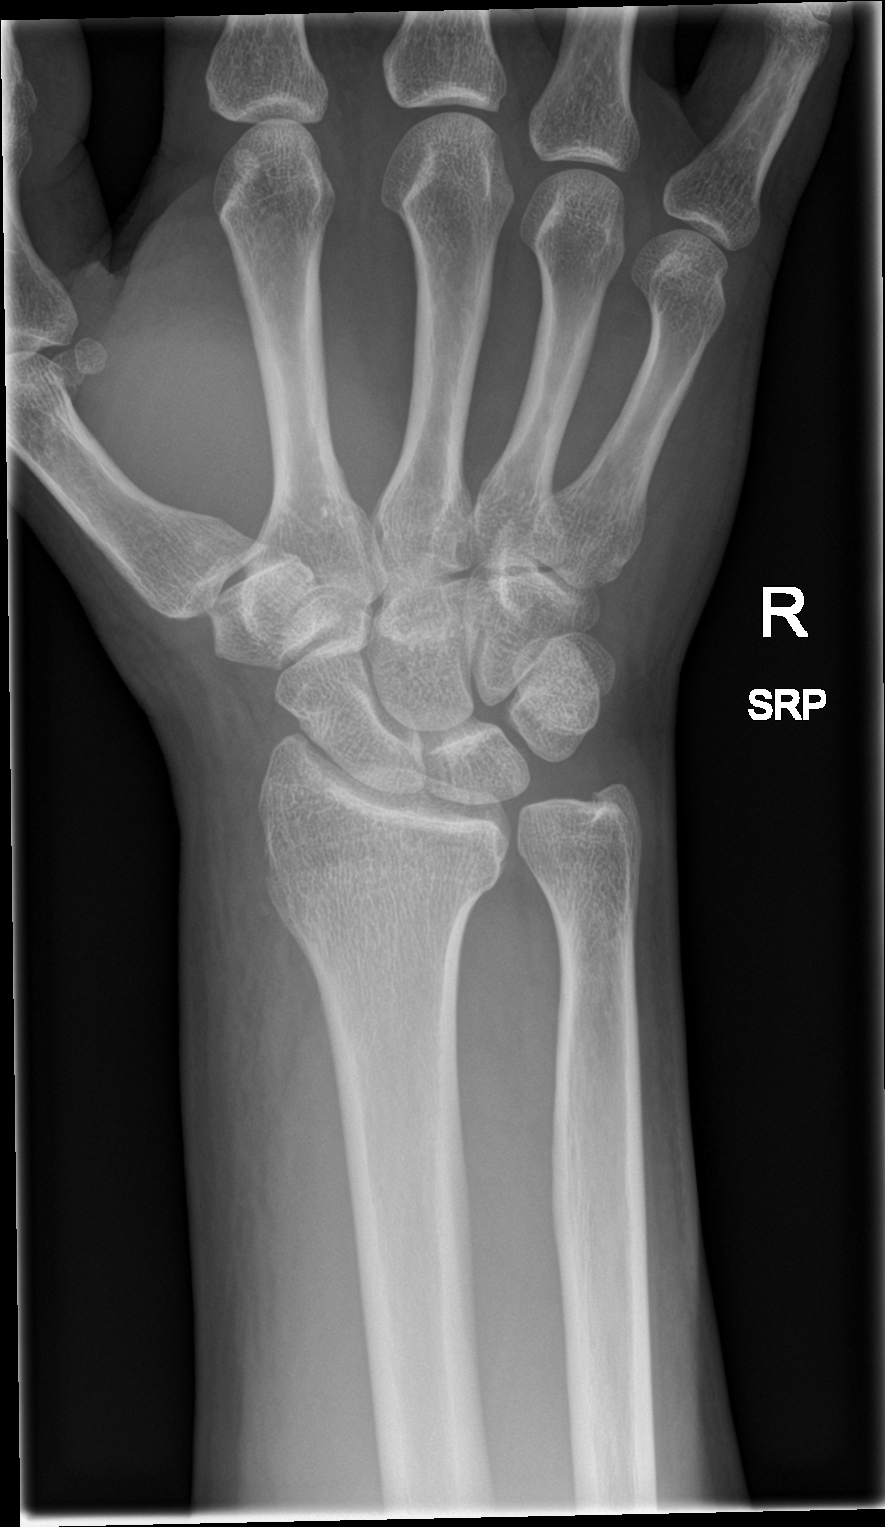

[wrist navicular]
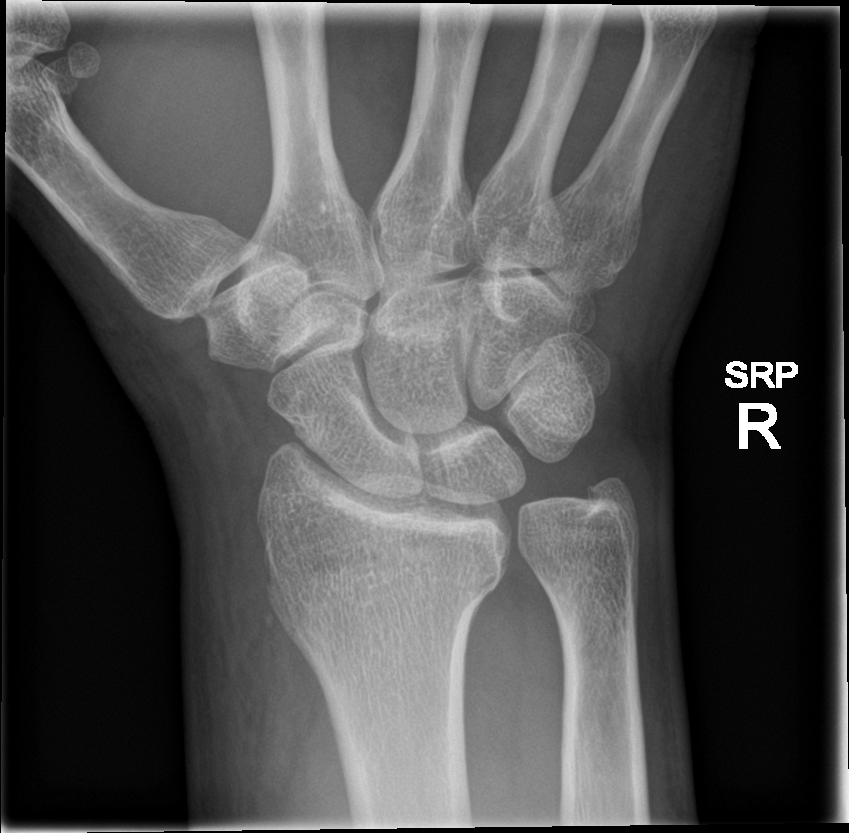

[wrist obl]
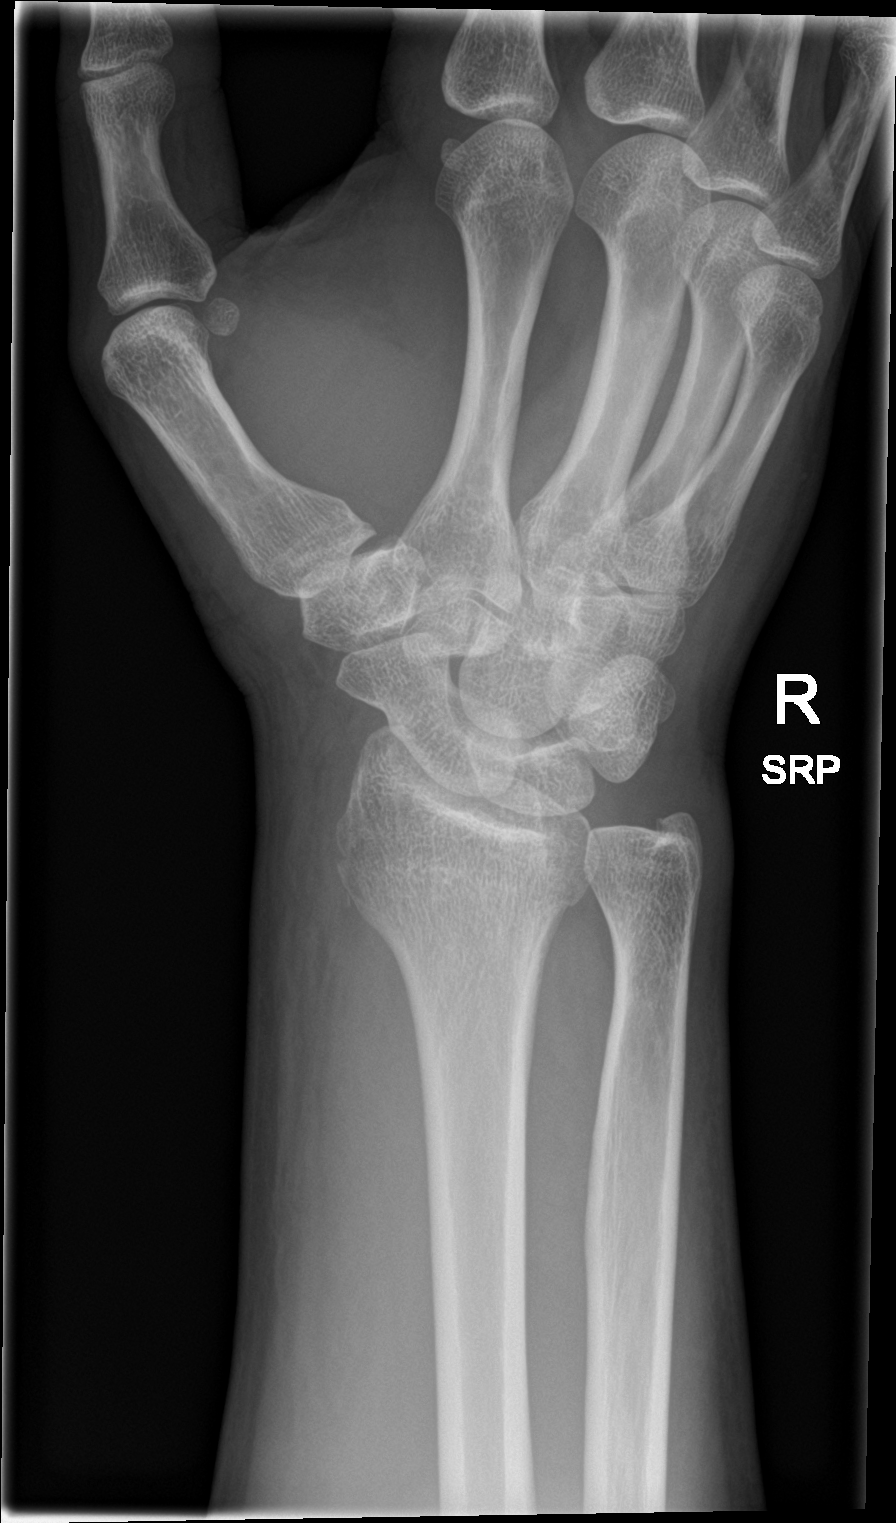

[wrist lat]
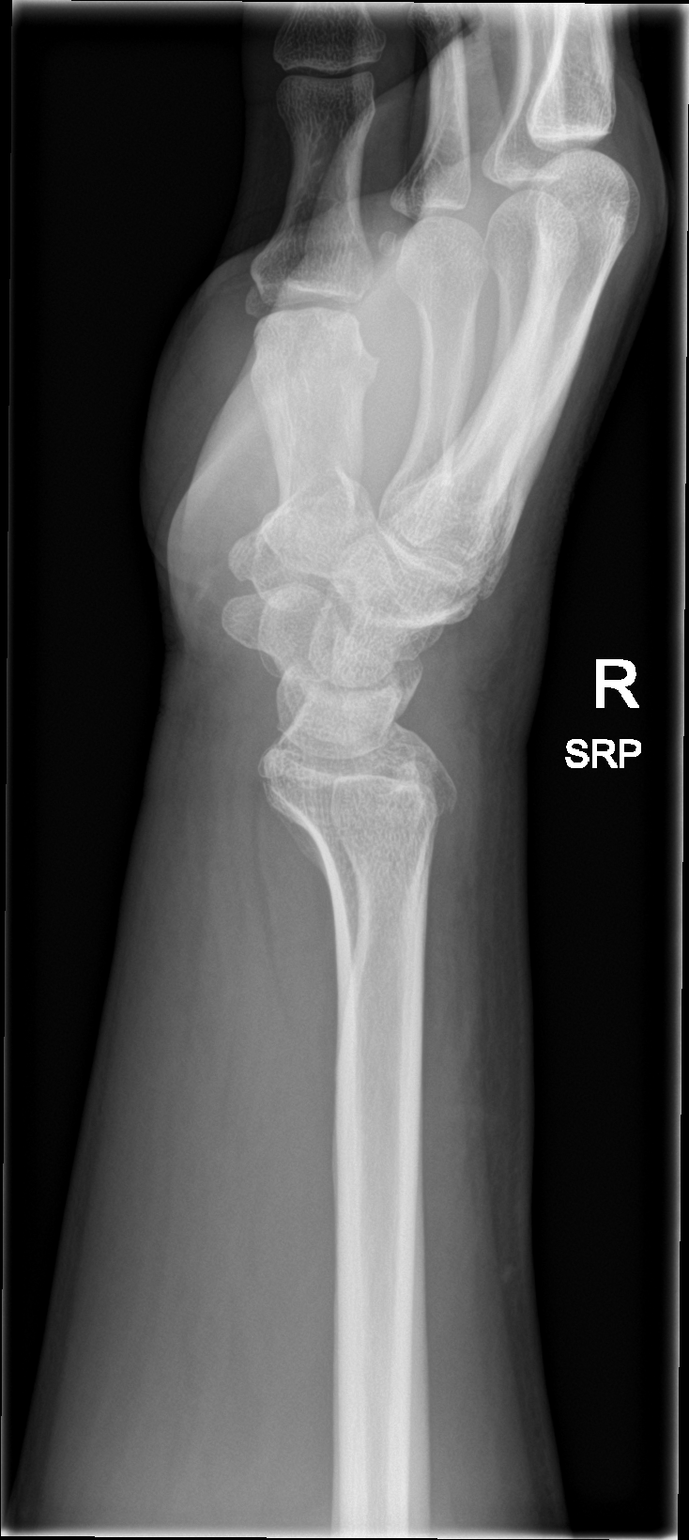

[4 of 4 positions shown; findings below may reference images not displayed]

FINDINGS: Diffuse soft tissue swelling. There is a distal radial fracture,
nondisplaced. No visible ulnar abnormality. No subluxation or
dislocation.
IMPRESSION: Nondisplaced distal right radial fracture.

## 2020-12-06 ENCOUNTER — Other Ambulatory Visit: Payer: Self-pay | Admitting: *Deleted

## 2020-12-06 DIAGNOSIS — I739 Peripheral vascular disease, unspecified: Secondary | ICD-10-CM

## 2020-12-11 ENCOUNTER — Encounter: Payer: BC Managed Care – PPO | Admitting: Surgery

## 2020-12-11 ENCOUNTER — Encounter (HOSPITAL_COMMUNITY): Payer: BC Managed Care – PPO

## 2021-01-08 ENCOUNTER — Encounter: Payer: BC Managed Care – PPO | Admitting: Surgery

## 2021-01-08 ENCOUNTER — Encounter (HOSPITAL_COMMUNITY): Payer: BC Managed Care – PPO

## 2021-02-19 ENCOUNTER — Encounter (HOSPITAL_COMMUNITY): Payer: BC Managed Care – PPO

## 2021-03-19 ENCOUNTER — Encounter (HOSPITAL_COMMUNITY): Payer: BC Managed Care – PPO

## 2021-04-23 ENCOUNTER — Encounter (HOSPITAL_COMMUNITY): Payer: Self-pay

## 2021-04-23 ENCOUNTER — Encounter (HOSPITAL_COMMUNITY): Payer: BC Managed Care – PPO

## 2021-09-05 ENCOUNTER — Encounter (HOSPITAL_COMMUNITY): Payer: Self-pay

## 2021-09-05 ENCOUNTER — Ambulatory Visit (HOSPITAL_COMMUNITY)
Admission: EM | Admit: 2021-09-05 | Discharge: 2021-09-05 | Disposition: A | Discharge status: Home / Self Care | Payer: BC Managed Care – PPO

## 2021-09-05 DIAGNOSIS — I10 Essential (primary) hypertension: Secondary | ICD-10-CM

## 2021-09-05 DIAGNOSIS — R040 Epistaxis: Secondary | ICD-10-CM

## 2021-09-05 MED ORDER — FLUTICASONE PROPIONATE 50 MCG/ACT NA SUSP: 2.0000 | Freq: Every day | NASAL | 2 refills | Status: AC

## 2021-09-05 NOTE — ED Provider Notes (Signed)
[facility]    CSN: [id number] Arrival date & time: 09/05/21  0815      History   Chief Complaint Chief Complaint  Patient presents with   Epistaxis    HPI Eddie Johnson is a 51 y.o. male.   Patient presents to urgent care for evaluation of recurrent nosebleeds from the left nare over the last 2 weeks.  Patient states that he has never had nosebleeds in the past and the nosebleed started at very random times.  He cannot identify triggering factor for the nosebleeds.  The first 1 started while he was in the shower and he was able to get the bleeding under control by holding pressure and tilting his head back.  The second time happened while he was cooking in the kitchen and the last time happened last night at approximately 3 AM waking him up out of his sleep.  Patient states that he is always able to get the bleeding to stop with a cottonball and pressure to his nose.  The bleeding is always from his left nare.  He does not take blood thinning medications and takes 5 mg of amlodipine in the morning, 5 mg of amlodipine in the evening at dinnertime, and 10 mg of lisinopril at bedtime for his blood pressure management.  Blood pressure is noticeably elevated in the clinic today at 195/98.  Patient states that this is normal for him in the morning and his blood pressure reduces throughout the day to approximately 150s over 80s by the time he goes to sleep.  Patient follows with primary care for blood pressure management and does not have a scheduled appointment for follow-up soon.  He denies headaches, sore throat, ear pain, eye drainage, fever/chills, neck pain, shortness of breath, chest pain, blurry vision, decreased visual acuity, and dizziness.  Patient states that he is able to swallow normally when he gets nosebleeds and does not notice blood going down the back of his throat.  He uses saline nasal spray consistently to his nose for congestion and drainage and has been  attempting use of this after nosebleeds.  No recent falls or LOC.  No recent injuries to the nose reported.  No other aggravating or relieving factors identified at this time for patient's symptoms.   Epistaxis   Past Medical History:  Diagnosis Date   GERD (gastroesophageal reflux disease)    Hypertension     Patient Active Problem List   Diagnosis Date Noted   Unspecified essential hypertension 09/01/2013    Past Surgical History:  Procedure Laterality Date   ORCHIOPEXY         Home Medications    Prior to Admission medications   Medication Sig Start Date End Date Taking? Authorizing Provider  amLODipine (NORVASC) 5 MG tablet TAKE 1 TABLET BY MOUTH TWICE A DAY 04/21/19  Yes Stallings, Zoe A, MD  atorvastatin (LIPITOR) 10 MG tablet Take 1 tablet (10 mg total) by mouth daily. 08/11/17  Yes Ofilia Neas, PA-C  cetirizine (ZYRTEC) 10 MG tablet Take 10 mg by mouth daily.   Yes [provider]  fluticasone (FLONASE) 50 MCG/ACT nasal spray Place 2 sprays into both nostrils daily. 09/05/21  Yes Carlisle Beers, FNP  lisinopril (ZESTRIL) 10 MG tablet Take 10 mg by mouth at bedtime.   Yes [provider]  omeprazole (PRILOSEC) 10 MG capsule Take 10 mg by mouth daily.   Yes [provider]  sodium chloride (OCEAN) 0.65 % SOLN nasal spray  Place 1 spray into both nostrils as needed for congestion.   Yes [provider]  vitamin C (ASCORBIC ACID) 500 MG tablet Take 1,000 mg by mouth daily.    Yes [provider]  acetaminophen (TYLENOL) 500 MG tablet Take 1,000 mg by mouth 2 (two) times daily as needed for mild pain.    [provider]  benzonatate (TESSALON) 100 MG capsule Take 1-2 capsules (100-200 mg total) by mouth 3 (three) times daily as needed for cough. 01/12/19   McVey, Madelaine Bhat, PA-C  furosemide (LASIX) 20 MG tablet Take 1 tab daily for three days for leg swelling then off for three days. 08/29/17   Ofilia Neas, PA-C  naproxen (NAPROSYN) 500 MG tablet Take 1 tablet (500 mg total) by mouth 2 (two) times daily with a meal. 03/03/18   Ofilia Neas, PA-C    Family History Family History  Problem Relation Age of Onset   Cancer Mother    COPD Mother    Emphysema Mother    Hypertension Father     Social History Social History   Tobacco Use   Smoking status: Former    Types: Cigarettes    Quit date: 06/2011    Years since quitting: 10.2   Smokeless tobacco: Never  Vaping Use   Vaping Use: Every day  Substance Use Topics   Alcohol use: Yes    Alcohol/week: 0.0 standard drinks of alcohol    Comment: Once yearly   Drug use: No     Allergies   Patient has no known allergies.   Review of Systems Review of Systems  HENT:  Positive for nosebleeds.   Per HPI   Physical Exam Triage Vital Signs ED Triage Vitals  Enc Vitals Group     BP 09/05/21 0822 (!) 195/98     Pulse Rate 09/05/21 0822 81     Resp 09/05/21 0822 16     Temp 09/05/21 0822 97.7 F (36.5 C)     Temp Source 09/05/21 0822 Oral     SpO2 09/05/21 0822 98 %     Weight 09/05/21 0825 238 lb (108 kg)     Height 09/05/21 0825 5\' 10"  (1.778 m)     Head Circumference --      Peak Flow --      Pain Score 09/05/21 0824 3     Pain Loc --      Pain Edu? --      Excl. in GC? --    No data found.  Updated Vital Signs BP (!) 195/98 (BP Location: Left Arm)   Pulse 81   Temp 97.7 F (36.5 C) (Oral)   Resp 16   Ht 5\' 10"  (1.778 m)   Wt 238 lb (108 kg)   SpO2 98%   BMI 34.15 kg/m   Visual Acuity Right Eye Distance:   Left Eye Distance:   Bilateral Distance:    Right Eye Near:   Left Eye Near:    Bilateral Near:     Physical Exam Vitals and nursing note reviewed.  Constitutional:      Appearance: Normal appearance. He is not ill-appearing or toxic-appearing.     Comments: Very pleasant patient sitting on exam in position of comfort table in no acute distress.   HENT:     Head: Normocephalic and  atraumatic.     Right Ear: Hearing, tympanic membrane, ear canal and external ear normal.     Left Ear: Hearing, tympanic membrane,  ear canal and external ear normal.     Nose: Nose normal. No nasal deformity or signs of injury.     Right Nostril: No epistaxis.     Left Nostril: No epistaxis.     Right Turbinates: Swollen.     Left Turbinates: Swollen.     Comments: No evidence of epistaxis at this time.  There is a small amount of blood to the distal left nare visualized with inspection that is not actively bleeding at this time.  Bilateral turbinates appear swollen and erythematous.  No nasal polyps visualized at this time.    Mouth/Throat:     Lips: Pink.     Mouth: Mucous membranes are moist.  Eyes:     General: Lids are normal. Vision grossly intact. Gaze aligned appropriately.     Extraocular Movements: Extraocular movements intact.     Conjunctiva/sclera: Conjunctivae normal.  Pulmonary:     Effort: Pulmonary effort is normal.  Abdominal:     Palpations: Abdomen is soft.  Musculoskeletal:     Cervical back: Neck supple.  Skin:    General: Skin is warm and dry.     Capillary Refill: Capillary refill takes less than 2 seconds.     Findings: No rash.  Neurological:     General: No focal deficit present.     Mental Status: He is alert and oriented to person, place, and time. Mental status is at baseline.     Cranial Nerves: No dysarthria or facial asymmetry.     Gait: Gait is intact.     Comments: No focal deficit to neuro exam.  5/5 strength throughout.  Psychiatric:        Mood and Affect: Mood normal.        Speech: Speech normal.        Behavior: Behavior normal.        Thought Content: Thought content normal.        Judgment: Judgment normal.      UC Treatments / Results  Labs (all labs ordered are listed, but only abnormal results are displayed) Labs Reviewed - No data to display  EKG   Radiology No results found.  Procedures Procedures (including  critical care time)  Medications Ordered in UC Medications - No data to display  Initial Impression / Assessment and Plan / UC Course  I have reviewed the triage vital signs and the nursing notes.  Pertinent labs & imaging results that were available during my care of the patient were reviewed by me and considered in my medical decision making (see chart for details).  Left-sided epistaxis Patient is not actively bleeding at this time from the nose bilaterally.  He is asymptomatic despite elevated blood pressure and there is a nonfocal neuro exam.  Patient to continue taking 5 mg of amlodipine in the morning and 5 mg at bedtime along with his 10 mg of lisinopril at bedtime for blood pressure management.  He is to start applying 2 puffs of Flonase into each nostril each morning to reduce swelling, erythema, and frequency of nosebleeds.  ENT walking referral given.  Patient advised that this may take multiple months for him to be seen by ENT and that he should follow-up with his PCP as soon as possible for blood pressure management as this may be contributing to his increased episodes of epistaxis.  He is to continue taking cetirizine once daily.  Work note provided.  Patient agreeable to this plan.   Discussed physical exam and  available lab work findings in clinic with patient.  Counseled patient regarding appropriate use of medications and potential side effects for all medications recommended or prescribed today. Discussed red flag signs and symptoms of worsening condition,when to call the PCP office, return to urgent care, and when to seek higher level of care in the emergency department. Patient verbalizes understanding and agreement with plan. All questions answered. Patient discharged in stable condition.  Final Clinical Impressions(s) / UC Diagnoses   Final diagnoses:  Primary hypertension  Left-sided epistaxis     Discharge Instructions      Start taking Flonase once daily by  applying 2 puffs into each nostril in the mornings. Continue taking 5 mg of amlodipine in the morning, then at bedtime. Continue taking your lisinopril 10 mg at bedtime. Schedule an appointment with your primary care doctor for follow-up regarding your blood pressure readings in the clinic today.  I believe this may be contributing to your frequent nosebleeds.   Continue taking your cetirizine once daily.   Call the phone number on your paperwork to schedule an appointment with the ENT doctor for follow-up.   Work note is at the end your packet.    ED Prescriptions     Medication Sig Dispense Auth. Provider   fluticasone (FLONASE) 50 MCG/ACT nasal spray Place 2 sprays into both nostrils daily. 16 g Carlisle Beers, FNP      PDMP not reviewed this encounter.   Reita May Britton, [place] 09/05/21 [phone]

## 2021-09-05 NOTE — Discharge Instructions (Signed)
Start taking Flonase once daily by applying 2 puffs into each nostril in the mornings. Continue taking 5 mg of amlodipine in the morning, then at bedtime. Continue taking your lisinopril 10 mg at bedtime. Schedule an appointment with your primary care doctor for follow-up regarding your blood pressure readings in the clinic today.  I believe this may be contributing to your frequent nosebleeds.   Continue taking your cetirizine once daily.   Call the phone number on your paperwork to schedule an appointment with the ENT doctor for follow-up.   Work note is at the end your packet.

## 2021-09-05 NOTE — ED Triage Notes (Signed)
Patient has had 5 nose bleed over the last 2 weeks. The first one was bad according to the Patient while he was in the shower.  Patient states the nose bleed happen at random times, only the left nostril.  No strenuous activities or high temperatures.  Patient states no history of nasal issues, sinus issues, or nose bleeds. No known falls or injuries.  History of hypertension. Slight headache for the past few weeks.

## 2021-11-06 ENCOUNTER — Other Ambulatory Visit: Payer: Self-pay

## 2021-11-06 ENCOUNTER — Ambulatory Visit (AMBULATORY_SURGERY_CENTER): Payer: Self-pay | Admitting: *Deleted

## 2021-11-06 VITALS — Ht 69.5 in | Wt 242.0 lb

## 2021-11-06 DIAGNOSIS — Z1211 Encounter for screening for malignant neoplasm of colon: Secondary | ICD-10-CM

## 2021-11-06 MED ORDER — NA SULFATE-K SULFATE-MG SULF 17.5-3.13-1.6 GM/177ML PO SOLN: 1.0000 | Freq: Once | ORAL | 0 refills | Status: AC

## 2021-11-06 NOTE — Progress Notes (Signed)
Pre visit completed over telephone.  Instructions forwarded through Mychart per patient request.  No egg or soy allergy known to patient  No issues known to pt with past sedation with any surgeries or procedures Patient denies ever being told they had issues or difficulty with intubation  No FH of Malignant Hyperthermia Pt is not on diet pills Pt is not on  home 02  Pt is not on blood thinners  Pt denies issues with constipation  No A fib or A flutter Pt instructed to use Single[employer] or GoodRx for a price reduction on prep

## 2021-11-19 ENCOUNTER — Encounter: Payer: BC Managed Care – PPO | Admitting: Internal Medicine

## 2021-12-07 ENCOUNTER — Telehealth: Payer: Self-pay | Admitting: Internal Medicine

## 2021-12-07 NOTE — Telephone Encounter (Addendum)
Patient called requesting to reschedule his procedure for Monday. York Spaniel his work put him on call and he will not be able to make it in.

## 2021-12-10 ENCOUNTER — Encounter: Payer: BC Managed Care – PPO | Admitting: Internal Medicine

## 2021-12-10 NOTE — Telephone Encounter (Signed)
Got it!     Thanks =).

## 2021-12-10 NOTE — Telephone Encounter (Signed)
Called patient to ask if he was ready to reschedule no answer left a voicemail, failed to cancel the actual appointment out of the schedule after sending the message to admitting on 12/07/21. My apology.

## 2022-10-21 ENCOUNTER — Other Ambulatory Visit: Payer: Self-pay | Admitting: *Deleted

## 2022-10-21 DIAGNOSIS — I809 Phlebitis and thrombophlebitis of unspecified site: Secondary | ICD-10-CM

## 2022-10-24 ENCOUNTER — Ambulatory Visit (HOSPITAL_COMMUNITY)
Admission: RE | Admit: 2022-10-24 | Discharge: 2022-10-24 | Disposition: A | Discharge status: Home / Self Care | Payer: BC Managed Care – PPO | Source: Ambulatory Visit | Attending: Vascular Surgery | Admitting: Vascular Surgery

## 2022-10-24 ENCOUNTER — Ambulatory Visit (INDEPENDENT_AMBULATORY_CARE_PROVIDER_SITE_OTHER): Payer: BC Managed Care – PPO | Admitting: Vascular Surgery

## 2022-10-24 ENCOUNTER — Encounter: Payer: Self-pay | Admitting: Vascular Surgery

## 2022-10-24 VITALS — BP 149/96 | HR 81 | Temp 98.7°F | Resp 18 | Ht 71.0 in | Wt 237.9 lb

## 2022-10-24 DIAGNOSIS — I8002 Phlebitis and thrombophlebitis of superficial vessels of left lower extremity: Secondary | ICD-10-CM

## 2022-10-24 DIAGNOSIS — I809 Phlebitis and thrombophlebitis of unspecified site: Secondary | ICD-10-CM | POA: Diagnosis present

## 2022-10-24 DIAGNOSIS — I872 Venous insufficiency (chronic) (peripheral): Secondary | ICD-10-CM | POA: Diagnosis not present

## 2022-10-24 NOTE — Progress Notes (Signed)
Patient ID: Eddie Johnson, male   DOB: December 16, 1970, 52 y.o.   MRN: [id number]  Reason for Consult: New Patient (Initial Visit) (>5 year history bilateral leg pain and swelling L>R )   Referred by Ralene Ok, MD  Subjective:     HPI:  Eddie Johnson is a 52 y.o. male evaluated here 5 years ago for bilateral lower extremity swelling with pain and rash.  He now has had recurrence of his symptoms.  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  He has been compliant with knee-high stockings for many years but has not worn thigh-high compression stockings.  More recently he has taken off work for 5 days to lay around the house and elevate his legs to help with the swelling.  He states that this is very uncomfortable and painful at the time that it occurs.  He has no history of varicosities and has no previous venous interventions.  He also has associated rash and restless leg syndrome.  Past Medical History:  Diagnosis Date   GERD (gastroesophageal reflux disease)    Hyperlipidemia    Hypertension    Family History  Problem Relation Age of Onset   Cancer Mother    COPD Mother    Emphysema Mother    Hypertension Father    Rectal cancer Maternal Grandfather    Past Surgical History:  Procedure Laterality Date   ORCHIOPEXY     REPAIR / SUTURE TESTICULAR INJURY     as a child    Short Social History:  Social History   Tobacco Use   Smoking status: Former    Current packs/day: 0.00    Types: Cigarettes    Quit date: 06/2011    Years since quitting: 11.3   Smokeless tobacco: Never  Substance Use Topics   Alcohol use: Yes    Alcohol/week: 0.0 standard drinks of alcohol    Comment: Once yearly    No Known Allergies  Current Outpatient Medications  Medication Sig Dispense Refill   acetaminophen (TYLENOL) 500 MG tablet Take 1,000 mg by mouth 2 (two) times daily as needed for mild pain.     amLODipine (NORVASC) 5 MG tablet TAKE 1 TABLET BY MOUTH TWICE A DAY  60 tablet 0   atorvastatin (LIPITOR) 10 MG tablet Take 1 tablet (10 mg total) by mouth daily. 90 tablet 3   benzonatate (TESSALON) 100 MG capsule Take 1-2 capsules (100-200 mg total) by mouth 3 (three) times daily as needed for cough. 40 capsule 0   cetirizine (ZYRTEC) 10 MG tablet Take 10 mg by mouth daily.     fluticasone (FLONASE) 50 MCG/ACT nasal spray Place 2 sprays into both nostrils daily. 16 g 2   furosemide (LASIX) 20 MG tablet Take 1 tab daily for three days for leg swelling then off for three days. 30 tablet 2   lisinopril (ZESTRIL) 10 MG tablet Take 10 mg by mouth at bedtime.     naproxen (NAPROSYN) 500 MG tablet Take 1 tablet (500 mg total) by mouth 2 (two) times daily with a meal. 30 tablet 0   omeprazole (PRILOSEC) 10 MG capsule Take 10 mg by mouth daily.     sodium chloride (OCEAN) 0.65 % SOLN nasal spray Place 1 spray into both nostrils as needed for congestion.     vitamin C (ASCORBIC ACID) 500 MG tablet Take 1,000 mg by mouth daily.      No current facility-administered medications for this visit.  Review of Systems  Constitutional:  Constitutional negative. HENT: HENT negative.  Eyes: Eyes negative.  Respiratory: Respiratory negative.  Cardiovascular: Positive for leg swelling.  GI: Gastrointestinal negative.  Musculoskeletal: Musculoskeletal negative. Positive for leg pain.  Skin: Positive for rash.  Neurological: Neurological negative.      Restless leg syndrome Hematologic: Hematologic/lymphatic negative.  Psychiatric: Psychiatric negative.        Objective:  Objective  Vitals:   10/24/22 1537  BP: (!) 149/96  Pulse: 81  Resp: 18  Temp: 98.7 F (37.1 C)  SpO2: 96%     Physical Exam HENT:     Head: Normocephalic.     Nose: Nose normal.  Eyes:     Pupils: Pupils are equal, round, and reactive to light.  Cardiovascular:     Rate and Rhythm: Normal rate.  Pulmonary:     Effort: Pulmonary effort is normal.  Abdominal:     General: Abdomen is  flat.  Musculoskeletal:     Cervical back: Normal range of motion and neck supple.     Right lower leg: Edema present.     Left lower leg: Edema present.  Skin:    General: Skin is warm.     Capillary Refill: Capillary refill takes less than 2 seconds.  Neurological:     General: No focal deficit present.     Mental Status: He is alert.  Psychiatric:        Mood and Affect: Mood normal.        Thought Content: Thought content normal.        Judgment: Judgment normal.     Data: LEFT          Reflux NoRefluxReflux TimeDiameter cmsComments                          Yes                                   +--------------+---------+------+-----------+------------+--------+  CFV          no                                              +--------------+---------+------+-----------+------------+--------+  FV mid        no                                              +--------------+---------+------+-----------+------------+--------+  Popliteal    no                                              +--------------+---------+------+-----------+------------+--------+  GSV at SFJ              yes    >500 ms      0.63              +--------------+---------+------+-----------+------------+--------+  GSV prox thigh          yes    >500 ms      0.56              +--------------+---------+------+-----------+------------+--------+  GSV mid thigh no                            0.39              +--------------+---------+------+-----------+------------+--------+  GSV dist thighno                            0.50              +--------------+---------+------+-----------+------------+--------+  GSV at knee   no                            0.46              +--------------+---------+------+-----------+------------+--------+  GSV prox calf no                            0.35               +--------------+---------+------+-----------+------------+--------+  SSV Pop Fossa           yes    >500 ms      0.36              +--------------+---------+------+-----------+------------+--------+  SSV prox calf no                            0.31              +--------------+---------+------+-----------+------------+--------+     Summary:  Left:  - No evidence of deep vein thrombosis seen in the left lower extremity,  from the common femoral through the popliteal veins.  - No evidence of superficial venous thrombosis in the left lower  extremity.  - The deep venous system is competent.  - The great saphenous vein is incompetent. at the saphenofemoral junction  and proximal thigh.  - The small saphenous vein is incompetent at the popliteal fossa.      Assessment/Plan:     52 year old male with bilateral lower extremity swelling particularly after long shifts standing on his feet.  He has reflux in the left lower extremity with a large great saphenous vein and we will fit him for thigh-high compression stockings today.  I will have him follow-up in 3 months to consider saphenous vein ablation on the left for C3 venous disease and we will also obtain right lower extremity venous reflux testing at the time and evaluate his veins at the bedside.  I discussed the need for continued exercise and weight loss thigh-high compression stockings when he is out of bed and elevation of his legs when he is recumbent.  Patient demonstrates good understanding we will follow-up in 3 months.    Maeola Harman MD [facility]

## 2022-10-30 ENCOUNTER — Other Ambulatory Visit: Payer: Self-pay

## 2022-10-30 DIAGNOSIS — I872 Venous insufficiency (chronic) (peripheral): Secondary | ICD-10-CM

## 2023-01-29 ENCOUNTER — Ambulatory Visit: Payer: Self-pay | Admitting: Vascular Surgery

## 2023-01-29 ENCOUNTER — Ambulatory Visit (HOSPITAL_COMMUNITY): Payer: Self-pay

## 2023-04-23 ENCOUNTER — Ambulatory Visit (HOSPITAL_COMMUNITY): Payer: Self-pay

## 2023-04-23 ENCOUNTER — Ambulatory Visit: Payer: Self-pay | Admitting: Vascular Surgery
